# Patient Record
Sex: Female | Born: 1995 | Race: Black or African American | Hispanic: No | Marital: Single | Smoking: Current every day smoker
Health system: Southern US, Community
[De-identification: ages and names within clinical notes are randomized; demographics above are authoritative.]

## PROBLEM LIST (undated history)

## (undated) ENCOUNTER — Inpatient Hospital Stay (HOSPITAL_COMMUNITY): Payer: Self-pay

## (undated) ENCOUNTER — Inpatient Hospital Stay: Payer: Self-pay

## (undated) DIAGNOSIS — D649 Anemia, unspecified: Secondary | ICD-10-CM

## (undated) DIAGNOSIS — Z862 Personal history of diseases of the blood and blood-forming organs and certain disorders involving the immune mechanism: Secondary | ICD-10-CM

## (undated) DIAGNOSIS — A64 Unspecified sexually transmitted disease: Secondary | ICD-10-CM

## (undated) DIAGNOSIS — F209 Schizophrenia, unspecified: Secondary | ICD-10-CM

## (undated) HISTORY — PX: NO PAST SURGERIES: SHX2092

---
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
## 2017-03-14 ENCOUNTER — Encounter (HOSPITAL_COMMUNITY): Payer: Self-pay

## 2017-06-02 ENCOUNTER — Emergency Department
Admission: EM | Admit: 2017-06-02 | Discharge: 2017-06-02 | Disposition: A | Discharge status: Home / Self Care | Payer: Medicaid Other | Attending: Emergency Medicine | Admitting: Emergency Medicine

## 2017-06-02 ENCOUNTER — Encounter: Payer: Self-pay | Admitting: *Deleted

## 2017-06-02 ENCOUNTER — Emergency Department: Payer: Medicaid Other

## 2017-06-02 DIAGNOSIS — K920 Hematemesis: Secondary | ICD-10-CM

## 2017-06-02 DIAGNOSIS — R1030 Lower abdominal pain, unspecified: Secondary | ICD-10-CM | POA: Insufficient documentation

## 2017-06-02 DIAGNOSIS — Z3A17 17 weeks gestation of pregnancy: Secondary | ICD-10-CM | POA: Diagnosis not present

## 2017-06-02 DIAGNOSIS — O26892 Other specified pregnancy related conditions, second trimester: Secondary | ICD-10-CM | POA: Diagnosis present

## 2017-06-02 DIAGNOSIS — F17210 Nicotine dependence, cigarettes, uncomplicated: Secondary | ICD-10-CM | POA: Diagnosis not present

## 2017-06-02 DIAGNOSIS — R109 Unspecified abdominal pain: Secondary | ICD-10-CM

## 2017-06-02 LAB — URINALYSIS, COMPLETE (UACMP) WITH MICROSCOPIC
BACTERIA UA: NONE SEEN
Bilirubin Urine: NEGATIVE
Glucose, UA: NEGATIVE mg/dL
HGB URINE DIPSTICK: NEGATIVE
Ketones, ur: NEGATIVE mg/dL
Leukocytes, UA: NEGATIVE
Nitrite: NEGATIVE
PROTEIN: NEGATIVE mg/dL
RBC / HPF: NONE SEEN RBC/hpf (ref 0–5)
Specific Gravity, Urine: 1.017 (ref 1.005–1.030)
pH: 7 (ref 5.0–8.0)

## 2017-06-02 LAB — CBC WITH DIFFERENTIAL/PLATELET
Basophils Absolute: 0.1 10*3/uL (ref 0–0.1)
Basophils Relative: 0 %
EOS ABS: 0.2 10*3/uL (ref 0–0.7)
Eosinophils Relative: 2 %
HEMATOCRIT: 35.5 % (ref 35.0–47.0)
HEMOGLOBIN: 12.3 g/dL (ref 12.0–16.0)
Lymphocytes Relative: 14 %
Lymphs Abs: 1.9 10*3/uL (ref 1.0–3.6)
MCH: 30.9 pg (ref 26.0–34.0)
MCHC: 34.6 g/dL (ref 32.0–36.0)
MCV: 89.4 fL (ref 80.0–100.0)
MONOS PCT: 6 %
Monocytes Absolute: 0.9 10*3/uL (ref 0.2–0.9)
NEUTROS ABS: 10.8 10*3/uL — AB (ref 1.4–6.5)
NEUTROS PCT: 78 %
Platelets: 246 10*3/uL (ref 150–440)
RBC: 3.97 MIL/uL (ref 3.80–5.20)
RDW: 13.5 % (ref 11.5–14.5)
WBC: 13.8 10*3/uL — AB (ref 3.6–11.0)

## 2017-06-02 LAB — COMPREHENSIVE METABOLIC PANEL
ALK PHOS: 56 U/L (ref 38–126)
ALT: 20 U/L (ref 14–54)
ANION GAP: 8 (ref 5–15)
AST: 30 U/L (ref 15–41)
Albumin: 3 g/dL — ABNORMAL LOW (ref 3.5–5.0)
BILIRUBIN TOTAL: 0.4 mg/dL (ref 0.3–1.2)
BUN: 7 mg/dL (ref 6–20)
CALCIUM: 8.7 mg/dL — AB (ref 8.9–10.3)
CO2: 22 mmol/L (ref 22–32)
CREATININE: 0.79 mg/dL (ref 0.44–1.00)
Chloride: 106 mmol/L (ref 101–111)
GFR calc Af Amer: >60 mL/min (ref >60)
GFR calc non Af Amer: >60 mL/min (ref >60)
GLUCOSE: 90 mg/dL (ref 65–99)
Potassium: 3.4 mmol/L — ABNORMAL LOW (ref 3.5–5.1)
SODIUM: 136 mmol/L (ref 135–145)
TOTAL PROTEIN: 7.2 g/dL (ref 6.5–8.1)

## 2017-06-02 LAB — POCT PREGNANCY, URINE: PREG TEST UR: POSITIVE — AB

## 2017-06-02 LAB — TROPONIN I

## 2017-06-02 LAB — LIPASE, BLOOD: Lipase: 18 U/L (ref 11–51)

## 2017-06-02 MED ORDER — RANITIDINE HCL 75 MG PO TABS: 75.0000 mg | ORAL_TABLET | Freq: Two times a day (BID) | ORAL | 0 refills | Status: DC

## 2017-06-02 MED ORDER — ACETAMINOPHEN 325 MG PO TABS
650.0000 mg | ORAL_TABLET | Freq: Once | ORAL | Status: AC
  Administered 2017-06-02: 650 mg via ORAL
  Filled 2017-06-02: qty 2

## 2017-06-02 NOTE — ED Provider Notes (Signed)
[facility] Emergency Department Provider Note  ____________________________________________   First MD Initiated Contact with Patient 06/02/17 [id number]     (approximate)  I have reviewed the triage vital signs and the nursing notes.   HISTORY  Chief Complaint Emesis   HPI Eileen Moody is a 21 y.o. female who is presenting at 1617 weeks gestation with nausea and vomiting as well as lower abdominal pain. She says she was at work this morning when she started to vomit. She said that she had blood streaks mixed with vomit and then eventually progressed to "chunks of blood." She says that the "chunks of blood" were about quarter sized and occurred only at the last episode of vomiting. She said she had a total of 4 episodes this morning. Denies any diarrhea. Said that she had chest burning after vomiting and abdominal pain which also started after vomiting. She describes abdominal pain in the lower abdomen at this time and a 6 out of 10. She says it is constant and crampy and radiates through to her low back. She denies any vaginal bleeding or discharge. Says that she has been receiving prenatal care for this pregnancy and is taking prenatal vitamins. She says that [place] prenatal care [place] in [place]. She denies any burning with urination. York SpanielSaid that she has had vomiting one other time in this pregnancy but otherwise has not had any issue. Has been able to drink since vomiting this morning and denies any nausea at this time.   History reviewed. No pertinent past medical history.  There are no active problems to display for this patient.   History reviewed. No pertinent surgical history.  Prior to Admission medications   Not on File    Allergies Patient has no known allergies.  No family history on file.  Social History Social History  Substance Use Topics  . Smoking status: Current Every Day Smoker    Packs/day: 1.00    Types: Cigarettes  . Smokeless tobacco:  Not on file  . Alcohol use No    Review of Systems  Constitutional: No fever/chills Eyes: No visual changes. ENT: No sore throat. Cardiovascular: as above Respiratory: Denies shortness of breath. Gastrointestinal:  No diarrhea.  No constipation. Genitourinary: Negative for dysuria. Musculoskeletal: Negative for back pain. Skin: Negative for rash. Neurological: Negative for headaches, focal weakness or numbness.   ____________________________________________   PHYSICAL EXAM:  VITAL SIGNS: ED Triage Vitals [06/02/17 0814]  Enc Vitals Group     BP      Pulse      Resp 20     Temp 98.3 F (36.8 C)     Temp Source Oral     SpO2      Weight 164 lb (74.4 kg)     Height 5\' 5"  (1.651 m)     Head Circumference      Peak Flow      Pain Score 4     Pain Loc      Pain Edu?      Excl. in GC?     Constitutional: Alert and oriented. Well appearing and in no acute distress. Eyes: Conjunctivae are normal.  Head: Atraumatic. Nose: No congestion/rhinnorhea. Mouth/Throat: Mucous membranes are moist.  Neck: No stridor.   Cardiovascular: Normal rate, regular rhythm. Grossly normal heart sounds.   Respiratory: Normal respiratory effort.  No retractions. Lungs CTAB. Gastrointestinal: Soft and nontender. No distention.  Musculoskeletal: No lower extremity tenderness nor edema.  No joint effusions. Neurologic:  Normal  speech and language. No gross focal neurologic deficits are appreciated. Skin:  Skin is warm, dry and intact. No rash noted. Psychiatric: Mood and affect are normal. Speech and behavior are normal.  ____________________________________________   LABS (all labs ordered are listed, but only abnormal results are displayed)  Labs Reviewed  CBC WITH DIFFERENTIAL/PLATELET - Abnormal; Notable for the following:       Result Value   WBC 13.8 (*)    Neutro Abs 10.8 (*)    All other components within normal limits  COMPREHENSIVE METABOLIC PANEL - Abnormal; Notable for the  following:    Potassium 3.4 (*)    Calcium 8.7 (*)    Albumin 3.0 (*)    All other components within normal limits  URINALYSIS, COMPLETE (UACMP) WITH MICROSCOPIC - Abnormal; Notable for the following:    Color, Urine YELLOW (*)    APPearance HAZY (*)    Squamous Epithelial / LPF 0-5 (*)    All other components within normal limits  POCT PREGNANCY, URINE - Abnormal; Notable for the following:    Preg Test, Ur POSITIVE (*)    All other components within normal limits  LIPASE, BLOOD  TROPONIN I  POC URINE PREG, ED   ____________________________________________  EKG  ED ECG REPORT I, Eileen Moody, the attending physician, personally viewed and interpreted this ECG.   Date: 06/02/2017  EKG Time: 0820  Rate: 82  Rhythm: normal sinus rhythm  Axis: Normal  Intervals:none  ST&T Change: No ST segment elevation or depression. No abnormal T-wave inversion.  ____________________________________________  RADIOLOGY  Unremarkable single IUP. ____________________________________________   PROCEDURES  Procedure(s) performed:   Procedures  Critical Care performed:   ____________________________________________   INITIAL IMPRESSION / ASSESSMENT AND PLAN / ED COURSE  Pertinent labs & imaging results that were available during my care of the patient were reviewed by me and considered in my medical decision making (see chart for details).  ----------------------------------------- 11:02 AM on 06/02/2017 ----------------------------------------- Patient without any complaints at this time. Actually caught the patient trying to elope but asked the patient if she would return to the room. No further episodes of vomiting. We'll discharge with Zantac. She'll be following up the [facility]. Possible gastritis causing hematemesis versus very mild Mallory-Weiss. However, the patient is chest pain-free at this time. No indication of serious pathology. Also  drinking a soft drink throughout her visit.       ____________________________________________   FINAL CLINICAL IMPRESSION(S) / ED DIAGNOSES  Final diagnoses:  Lower abdominal pain   Hematemesis.   NEW MEDICATIONS STARTED DURING THIS VISIT:  New Prescriptions   No medications on file     Note:  This document was prepared using Dragon voice recognition software and may include unintentional dictation errors.     Myrna Blazer, MD 06/02/17 [phone]

## 2017-06-02 NOTE — ED Triage Notes (Signed)
Pt is [redacted] weeks pregnant, pt reports getting to work today vomited once , pt reports vomiting blood today, pt complains of abdominal and chest pain

## 2017-06-11 ENCOUNTER — Ambulatory Visit (HOSPITAL_COMMUNITY)
Admission: EM | Admit: 2017-06-11 | Discharge: 2017-06-11 | Disposition: A | Discharge status: Home / Self Care | Payer: Medicaid Other | Attending: Family Medicine | Admitting: Family Medicine

## 2017-06-11 ENCOUNTER — Encounter (HOSPITAL_COMMUNITY): Payer: Self-pay | Admitting: Emergency Medicine

## 2017-06-11 DIAGNOSIS — Z113 Encounter for screening for infections with a predominantly sexual mode of transmission: Secondary | ICD-10-CM

## 2017-06-11 DIAGNOSIS — F17210 Nicotine dependence, cigarettes, uncomplicated: Secondary | ICD-10-CM | POA: Insufficient documentation

## 2017-06-11 DIAGNOSIS — Z202 Contact with and (suspected) exposure to infections with a predominantly sexual mode of transmission: Secondary | ICD-10-CM | POA: Diagnosis present

## 2017-06-11 DIAGNOSIS — Z331 Pregnant state, incidental: Secondary | ICD-10-CM

## 2017-06-11 DIAGNOSIS — N898 Other specified noninflammatory disorders of vagina: Secondary | ICD-10-CM

## 2017-06-11 DIAGNOSIS — N760 Acute vaginitis: Secondary | ICD-10-CM

## 2017-06-11 LAB — POCT URINALYSIS DIP (DEVICE)
BILIRUBIN URINE: NEGATIVE
GLUCOSE, UA: NEGATIVE mg/dL
Hgb urine dipstick: NEGATIVE
LEUKOCYTES UA: NEGATIVE
Nitrite: NEGATIVE
Protein, ur: 30 mg/dL — AB
Specific Gravity, Urine: >=1.03 (ref 1.005–1.030)
Urobilinogen, UA: 1 mg/dL (ref 0.0–1.0)
pH: 6 (ref 5.0–8.0)

## 2017-06-11 MED ORDER — FLUCONAZOLE 150 MG PO TABS: 150.0000 mg | ORAL_TABLET | Freq: Once | ORAL | 0 refills | Status: AC

## 2017-06-11 NOTE — ED Notes (Signed)
Sent to bathroom to obtain a clean and dirty urine

## 2017-06-11 NOTE — ED Triage Notes (Signed)
Vaginal itching and irritation

## 2017-06-11 NOTE — Discharge Instructions (Signed)
The preliminary urine results are normal. Therefore proceeding with treatment of a yeast infection.  If your symptoms do not improve in 48 hours, or if you experience worsening symptoms, please go to [facility]

## 2017-06-11 NOTE — ED Provider Notes (Signed)
[facility]    CSN: [id number] Arrival date & time: 06/11/17  1827     History   Chief Complaint Chief Complaint  Patient presents with  . Exposure to STD    HPI Eileen Moody is a 21 y.o. female.   Is a 21 year old gravida 3 para 2 woman who is 20+ weeks pregnant and has postcoital soreness for the last couple days. She saw her regular doctor in May and was tested for all STDs. She's been monogamous with her fianc but is not sure his pain monogamous with her. She wants complete STD testing.  Patient has no discharge or bleeding. She denies fever.      History reviewed. No pertinent past medical history.  There are no active problems to display for this patient.   History reviewed. No pertinent surgical history.  OB History    Gravida Para Term Preterm AB Living   2             SAB TAB Ectopic Multiple Live Births                   Home Medications    Prior to Admission medications   Medication Sig Start Date End Date Taking? Authorizing Provider  Prenatal Multivit-Min-Fe-FA (PRENATAL VITAMINS PO) Take by mouth.   Yes [provider]  fluconazole (DIFLUCAN) 150 MG tablet Take 1 tablet (150 mg total) by mouth once. Repeat if needed 06/11/17 06/11/17  Elvina Sidle, MD    Family History No family history on file.  Social History Social History  Substance Use Topics  . Smoking status: Current Every Day Smoker    Packs/day: 1.00    Types: Cigarettes  . Smokeless tobacco: Not on file  . Alcohol use No     Allergies   Patient has no known allergies.   Review of Systems Review of Systems  Genitourinary: Positive for vaginal pain.  All other systems reviewed and are negative.    Physical Exam Triage Vital Signs ED Triage Vitals  Enc Vitals Group     BP 06/11/17 1904 (!) 99/53     Pulse Rate 06/11/17 1904 79     Resp 06/11/17 1904 18     Temp 06/11/17 1904 98.4 F (36.9 C)     Temp Source 06/11/17 1904 Oral     SpO2  06/11/17 1904 99 %     Weight --      Height --      Head Circumference --      Peak Flow --      Pain Score 06/11/17 1901 7     Pain Loc --      Pain Edu? --      Excl. in GC? --    No data found.   Updated Vital Signs BP (!) 99/53 (BP Location: Right Arm)   Pulse 79   Temp 98.4 F (36.9 C) (Oral)   Resp 18   SpO2 99%    Physical Exam  Constitutional: She is oriented to person, place, and time. She appears well-developed and well-nourished.  HENT:  Right Ear: External ear normal.  Left Ear: External ear normal.  Mouth/Throat: Oropharynx is clear and moist.  Eyes: Conjunctivae are normal. Pupils are equal, round, and reactive to light.  Neck: Normal range of motion. Neck supple.  Pulmonary/Chest: Effort normal.  Musculoskeletal: Normal range of motion.  Neurological: She is alert and oriented to person, place, and time.  Skin: Skin is warm and dry.  Nursing note and vitals reviewed.    UC Treatments / Results  Labs (all labs ordered are listed, but only abnormal results are displayed) Labs Reviewed  POCT URINALYSIS DIP (DEVICE) - Abnormal; Notable for the following:       Result Value   Ketones, ur TRACE (*)    Protein, ur 30 (*)    All other components within normal limits  URINE CYTOLOGY ANCILLARY ONLY    EKG  EKG Interpretation None       Radiology No results found.  Procedures Procedures (including critical care time)  Medications Ordered in UC Medications - No data to display   Initial Impression / Assessment and Plan / UC Course  I have reviewed the triage vital signs and the nursing notes.  Pertinent labs & imaging results that were available during my care of the patient were reviewed by me and considered in my medical decision making (see chart for details).     Final Clinical Impressions(s) / UC Diagnoses   Final diagnoses:  Vaginitis and vulvovaginitis    New Prescriptions New Prescriptions   FLUCONAZOLE (DIFLUCAN) 150 MG  TABLET    Take 1 tablet (150 mg total) by mouth once. Repeat if needed     Elvina SidleLauenstein, Sarra Rachels, MD 06/11/17 1950

## 2017-06-12 LAB — URINE CYTOLOGY ANCILLARY ONLY
Chlamydia: NEGATIVE
Neisseria Gonorrhea: NEGATIVE
Trichomonas: NEGATIVE

## 2017-06-14 LAB — URINE CYTOLOGY ANCILLARY ONLY
Bacterial vaginitis: NEGATIVE
Candida vaginitis: NEGATIVE

## 2017-07-12 LAB — OB RESULTS CONSOLE RPR: RPR: NONREACTIVE

## 2017-07-12 LAB — OB RESULTS CONSOLE RUBELLA ANTIBODY, IGM: RUBELLA: NON-IMMUNE/NOT IMMUNE

## 2017-07-12 LAB — OB RESULTS CONSOLE HIV ANTIBODY (ROUTINE TESTING): HIV: NONREACTIVE

## 2017-07-12 LAB — OB RESULTS CONSOLE HEPATITIS B SURFACE ANTIGEN: HEP B S AG: NEGATIVE

## 2017-07-12 LAB — OB RESULTS CONSOLE VARICELLA ZOSTER ANTIBODY, IGG: VARICELLA IGG: IMMUNE

## 2017-08-22 ENCOUNTER — Encounter (HOSPITAL_COMMUNITY): Payer: Self-pay | Admitting: *Deleted

## 2017-08-22 ENCOUNTER — Inpatient Hospital Stay (HOSPITAL_COMMUNITY): Payer: Medicaid Other

## 2017-08-22 ENCOUNTER — Inpatient Hospital Stay (HOSPITAL_COMMUNITY)
Admission: AD | Admit: 2017-08-22 | Discharge: 2017-08-22 | Disposition: A | Discharge status: Home / Self Care | Payer: Medicaid Other | Source: Ambulatory Visit | Attending: Obstetrics and Gynecology | Admitting: Obstetrics and Gynecology

## 2017-08-22 DIAGNOSIS — O368390 Maternal care for abnormalities of the fetal heart rate or rhythm, unspecified trimester, not applicable or unspecified: Secondary | ICD-10-CM | POA: Diagnosis not present

## 2017-08-22 DIAGNOSIS — Z3A30 30 weeks gestation of pregnancy: Secondary | ICD-10-CM | POA: Insufficient documentation

## 2017-08-22 DIAGNOSIS — O368930 Maternal care for other specified fetal problems, third trimester, not applicable or unspecified: Secondary | ICD-10-CM | POA: Insufficient documentation

## 2017-08-22 DIAGNOSIS — R609 Edema, unspecified: Secondary | ICD-10-CM

## 2017-08-22 LAB — URINALYSIS, ROUTINE W REFLEX MICROSCOPIC
BILIRUBIN URINE: NEGATIVE
GLUCOSE, UA: NEGATIVE mg/dL
HGB URINE DIPSTICK: NEGATIVE
Ketones, ur: NEGATIVE mg/dL
NITRITE: NEGATIVE
Protein, ur: NEGATIVE mg/dL
SPECIFIC GRAVITY, URINE: 1.015 (ref 1.005–1.030)
pH: 6 (ref 5.0–8.0)

## 2017-08-22 MED ORDER — ACETAMINOPHEN 325 MG PO TABS
650.0000 mg | ORAL_TABLET | Freq: Four times a day (QID) | ORAL | Status: DC | PRN
  Administered 2017-08-22: 650 mg via ORAL
  Filled 2017-08-22: qty 2

## 2017-08-22 NOTE — MAU Provider Note (Signed)
Chief Complaint:  Headache   First Provider Initiated Contact with Patient 08/22/17 2010     HPI: Eileen Moody is a 21 y.o. G3P2002 at 6w4dwho presents to maternity admissions reporting swelling in feet for the past 2 weeks.  Also has intermittent headaches with visual changes  Does admit that her migraines in past have presented like that.  Mild headache now. Has not tried any Tylenol.  Foot swelling goes down after elevation.  Thinks she has preeclampsia.. She reports good fetal movement, denies LOF, vaginal bleeding, vaginal itching/burning, urinary symptoms,  dizziness, n/v, diarrhea, constipation or fever/chills.  She denies visual changes or RUQ abdominal pain.  Headache   This is a recurrent problem. The current episode started today. The problem occurs intermittently. The problem has been waxing and waning. The quality of the pain is described as aching. The pain is mild. Pertinent negatives include no abdominal pain, blurred vision, fever, photophobia, seizures, sinus pressure, sore throat or visual change. Nothing aggravates the symptoms. She has tried nothing for the symptoms.    RN Note: Pt started having some swelling in her hands and feet about two weeks ago as well as headaches and seeing stars.  She elevates her feet occasionally to help.  Has taken tylenol in the past for the headaches and it helped.   Past Medical History: History reviewed. No pertinent past medical history.  Past obstetric history: OB History  Gravida Para Term Preterm AB Living  3 2 2     2   SAB TAB Ectopic Multiple Live Births          2    # Outcome Date GA Lbr Len/2nd Weight Sex Delivery Anes PTL Lv  3 Current           2 Term 2017     Vag-Spont   LIV  1 Term 2014     Vag-Spont   LIV      Past Surgical History: History reviewed. No pertinent surgical history.  Family History: History reviewed. No pertinent family history.  Social History: Social History  Substance Use Topics  .  Smoking status: Current Every Day Smoker    Packs/day: 1.00    Types: Cigarettes  . Smokeless tobacco: Never Used  . Alcohol use No    Allergies: No Known Allergies  Meds:  Prescriptions Prior to Admission  Medication Sig Dispense Refill Last Dose  . Prenatal Multivit-Min-Fe-FA (PRENATAL VITAMINS PO) Take by mouth.       I have reviewed patient's Past Medical Hx, Surgical Hx, Family Hx, Social Hx, medications and allergies.   ROS:  Review of Systems  Constitutional: Negative for fever.  HENT: Negative for sinus pressure and sore throat.   Eyes: Negative for blurred vision and photophobia.  Gastrointestinal: Negative for abdominal pain.  Neurological: Positive for headaches. Negative for seizures.   Other systems negative  Physical Exam  Patient Vitals for the past 24 hrs:  BP Temp Temp src Pulse Resp  08/22/17 1954 122/76 97.8 F (36.6 C) Oral (!) 102 16   Vitals:   08/22/17 2041 08/22/17 2051 08/22/17 2100 08/22/17 2244  BP: (!) 112/53 (!) 108/58 107/61 (!) 110/52  Pulse: 92 93 92 89  Resp:    18  Temp:    98.1 F (36.7 C)  TempSrc:    Oral    Constitutional: Well-developed, well-nourished female in no acute distress.  Cardiovascular: normal rate and rhythm Respiratory: normal effort, clear to auscultation bilaterally GI: Abd soft, non-tender, gravid appropriate  for gestational age.   No rebound or guarding. MS: Extremities nontender, maybe slight trace pedal edema, normal ROM Neurologic: Alert and oriented x 4.  GU: Neg CVAT.  PELVIC EXAM:  deferred  FHT:  Baseline 125 , moderate variability, accelerations present, no decelerations, but there was one area where FHR went down tow 115-120,?decel Contractions:  Rare   Labs: Results for orders placed or performed during the hospital encounter of 08/22/17 (from the past 24 hour(s))  Urinalysis, Routine w reflex microscopic     Status: Abnormal   Collection Time: 08/22/17  7:43 PM  Result Value Ref Range    Color, Urine YELLOW YELLOW   APPearance CLOUDY (A) CLEAR   Specific Gravity, Urine 1.015 1.005 - 1.030   pH 6.0 5.0 - 8.0   Glucose, UA NEGATIVE NEGATIVE mg/dL   Hgb urine dipstick NEGATIVE NEGATIVE   Bilirubin Urine NEGATIVE NEGATIVE   Ketones, ur NEGATIVE NEGATIVE mg/dL   Protein, ur NEGATIVE NEGATIVE mg/dL   Nitrite NEGATIVE NEGATIVE   Leukocytes, UA LARGE (A) NEGATIVE   RBC / HPF 6-30 0 - 5 RBC/hpf   WBC, UA TOO NUMEROUS TO COUNT 0 - 5 WBC/hpf   Bacteria, UA MANY (A) NONE SEEN   Squamous Epithelial / LPF TOO NUMEROUS TO COUNT (A) NONE SEEN   Mucus PRESENT       Imaging:  US ordered BPP 8/8 Amniotic fluid normal  MAU Course/MDM: I have ordered labs and reviewed results.  UA with leukocytes but it is not a good clean catch. NST reviewed BPP ordered due to questionable small decel lasting 20 seconds.  BPP 8/8 with normal fluid BPs are totally normal, even on the low side Discussed hypertension is essential in diagnosis of preeclampsia Discussed dependent edema Push fluids and elevate feet  Assessment: Single IUP at 5210w5d Dependent edema Questionable FHR decel with normal BPP  Plan: Discharge home Watch swelling, get BP checked if it increases. Preterm Labor precautions and fetal kick counts Follow up in Office for prenatal visits and recheck  Encouraged to return here or to other Urgent Care/ED if she develops worsening of symptoms, increase in pain, fever, or other concerning symptoms.   Pt stable at time of discharge.  Wynelle BourgeoisMarie Korion Cuevas CNM, MSN Certified Nurse-Midwife 08/22/2017 8:34 PM

## 2017-08-22 NOTE — Discharge Instructions (Signed)
Peripheral Edema Peripheral edema is swelling that is caused by a buildup of fluid. Peripheral edema most often affects the lower legs, ankles, and feet. It can also develop in the arms, hands, and face. The area of the body that has peripheral edema will look swollen. It may also feel heavy or warm. Your clothes may start to feel tight. Pressing on the area may make a temporary dent in your skin. You may not be able to move your arm or leg as much as usual. There are many causes of peripheral edema. It can be a complication of other diseases, such as congestive heart failure, kidney disease, or a problem with your blood circulation. It also can be a side effect of certain medicines. It often happens to women during pregnancy. Sometimes, the cause is not known. Treating the underlying condition is often the only treatment for peripheral edema. Follow these instructions at home: Pay attention to any changes in your symptoms. Take these actions to help with your discomfort:  Raise (elevate) your legs while you are sitting or lying down.  Move around often to prevent stiffness and to lessen swelling. Do not sit or stand for long periods of time.  Wear support stockings as told by your health care provider.  Follow instructions from your health care provider about limiting salt (sodium) in your diet. Sometimes eating less salt can reduce swelling.  Take over-the-counter and prescription medicines only as told by your health care provider. Your health care provider may prescribe medicine to help your body get rid of excess water (diuretic).  Keep all follow-up visits as told by your health care provider. This is important.  Contact a health care provider if:  You have a fever.  Your edema starts suddenly or is getting worse, especially if you are pregnant or have a medical condition.  You have swelling in only one leg.  You have increased swelling and pain in your legs. Get help right away  if:  You develop shortness of breath, especially when you are lying down.  You have pain in your chest or abdomen.  You feel weak.  You faint. This information is not intended to replace advice given to you by your health care provider. Make sure you discuss any questions you have with your health care provider. Document Released: 01/11/2005 Document Revised: 05/08/2016 Document Reviewed: 06/16/2015 Elsevier Interactive Patient Education  2018 [employer].  Third Trimester of Pregnancy The third trimester is from week 28 through week 40 (months 7 through 9). The third trimester is a time when the unborn baby (fetus) is growing rapidly. At the end of the ninth month, the fetus is about 20 inches in length and weighs 6-10 pounds. Body changes during your third trimester Your body will continue to go through many changes during pregnancy. The changes vary from woman to woman. During the third trimester:  Your weight will continue to increase. You can expect to gain 25-35 pounds (11-16 kg) by the end of the pregnancy.  You may begin to get stretch marks on your hips, abdomen, and breasts.  You may urinate more often because the fetus is moving lower into your pelvis and pressing on your bladder.  You may develop or continue to have heartburn. This is caused by increased hormones that slow down muscles in the digestive tract.  You may develop or continue to have constipation because increased hormones slow digestion and cause the muscles that push waste through your intestines to relax.  You may develop hemorrhoids. These are swollen veins (varicose veins) in the rectum that can itch or be painful.  You may develop swollen, bulging veins (varicose veins) in your legs.  You may have increased body aches in the pelvis, back, or thighs. This is due to weight gain and increased hormones that are relaxing your joints.  You may have changes in your hair. These can include thickening of your  hair, rapid growth, and changes in texture. Some women also have hair loss during or after pregnancy, or hair that feels dry or thin. Your hair will most likely return to normal after your baby is born.  Your breasts will continue to grow and they will continue to become tender. A yellow fluid (colostrum) may leak from your breasts. This is the first milk you are producing for your baby.  Your belly button may stick out.  You may notice more swelling in your hands, face, or ankles.  You may have increased tingling or numbness in your hands, arms, and legs. The skin on your belly may also feel numb.  You may feel short of breath because of your expanding uterus.  You may have more problems sleeping. This can be caused by the size of your belly, increased need to urinate, and an increase in your body's metabolism.  You may notice the fetus "dropping," or moving lower in your abdomen (lightening).  You may have increased vaginal discharge.  You may notice your joints feel loose and you may have pain around your pelvic bone.  What to expect at prenatal visits You will have prenatal exams every 2 weeks until week 36. Then you will have weekly prenatal exams. During a routine prenatal visit:  You will be weighed to make sure you and the baby are growing normally.  Your blood pressure will be taken.  Your abdomen will be measured to track your baby's growth.  The fetal heartbeat will be listened to.  Any test results from the previous visit will be discussed.  You may have a cervical check near your due date to see if your cervix has softened or thinned (effaced).  You will be tested for Group B streptococcus. This happens between 35 and 37 weeks.  Your health care provider may ask you:  What your birth plan is.  How you are feeling.  If you are feeling the baby move.  If you have had any abnormal symptoms, such as leaking fluid, bleeding, severe headaches, or abdominal  cramping.  If you are using any tobacco products, including cigarettes, chewing tobacco, and electronic cigarettes.  If you have any questions.  Other tests or screenings that may be performed during your third trimester include:  Blood tests that check for low iron levels (anemia).  Fetal testing to check the health, activity level, and growth of the fetus. Testing is done if you have certain medical conditions or if there are problems during the pregnancy.  Nonstress test (NST). This test checks the health of your baby to make sure there are no signs of problems, such as the baby not getting enough oxygen. During this test, a belt is placed around your belly. The baby is made to move, and its heart rate is monitored during movement.  What is false labor? False labor is a condition in which you feel small, irregular tightenings of the muscles in the womb (contractions) that usually go away with rest, changing position, or drinking water. These are called Braxton Hicks contractions. Contractions  may last for hours, days, or even weeks before true labor sets in. If contractions come at regular intervals, become more frequent, increase in intensity, or become painful, you should see your health care provider. What are the signs of labor?  Abdominal cramps.  Regular contractions that start at 10 minutes apart and become stronger and more frequent with time.  Contractions that start on the top of the uterus and spread down to the lower abdomen and back.  Increased pelvic pressure and dull back pain.  A watery or bloody mucus discharge that comes from the vagina.  Leaking of amniotic fluid. This is also known as your "water breaking." It could be a slow trickle or a gush. Let your health care provider know if it has a color or strange odor. If you have any of these signs, call your health care provider right away, even if it is before your due date. Follow these instructions at  home: Medicines  Follow your health care provider's instructions regarding medicine use. Specific medicines may be either safe or unsafe to take during pregnancy.  Take a prenatal vitamin that contains at least 600 micrograms (mcg) of folic acid.  If you develop constipation, try taking a stool softener if your health care provider approves. Eating and drinking  Eat a balanced diet that includes fresh fruits and vegetables, whole grains, good sources of protein such as meat, eggs, or tofu, and low-fat dairy. Your health care provider will help you determine the amount of weight gain that is right for you.  Avoid raw meat and uncooked cheese. These carry germs that can cause birth defects in the baby.  If you have low calcium intake from food, talk to your health care provider about whether you should take a daily calcium supplement.  Eat four or five small meals rather than three large meals a day.  Limit foods that are high in fat and processed sugars, such as fried and sweet foods.  To prevent constipation: ? Drink enough fluid to keep your urine clear or pale yellow. ? Eat foods that are high in fiber, such as fresh fruits and vegetables, whole grains, and beans. Activity  Exercise only as directed by your health care provider. Most women can continue their usual exercise routine during pregnancy. Try to exercise for 30 minutes at least 5 days a week. Stop exercising if you experience uterine contractions.  Avoid heavy lifting.  Do not exercise in extreme heat or humidity, or at high altitudes.  Wear low-heel, comfortable shoes.  Practice good posture.  You may continue to have sex unless your health care provider tells you otherwise. Relieving pain and discomfort  Take frequent breaks and rest with your legs elevated if you have leg cramps or low back pain.  Take warm sitz baths to soothe any pain or discomfort caused by hemorrhoids. Use hemorrhoid cream if your health  care provider approves.  Wear a good support bra to prevent discomfort from breast tenderness.  If you develop varicose veins: ? Wear support pantyhose or compression stockings as told by your healthcare provider. ? Elevate your feet for 15 minutes, 3-4 times a day. Prenatal care  Write down your questions. Take them to your prenatal visits.  Keep all your prenatal visits as told by your health care provider. This is important. Safety  Wear your seat belt at all times when driving.  Make a list of emergency phone numbers, including numbers for family, friends, the hospital, and police and  Garment/textile technologist. General instructions  Avoid cat litter boxes and soil used by cats. These carry germs that can cause birth defects in the baby. If you have a cat, ask someone to clean the litter box for you.  Do not travel far distances unless it is absolutely necessary and only with the approval of your health care provider.  Do not use hot tubs, steam rooms, or saunas.  Do not drink alcohol.  Do not use any products that contain nicotine or tobacco, such as cigarettes and e-cigarettes. If you need help quitting, ask your health care provider.  Do not use any medicinal herbs or unprescribed drugs. These chemicals affect the formation and growth of the baby.  Do not douche or use tampons or scented sanitary pads.  Do not cross your legs for long periods of time.  To prepare for the arrival of your baby: ? Take prenatal classes to understand, practice, and ask questions about labor and delivery. ? Make a trial run to the hospital. ? Visit the hospital and tour the maternity area. ? Arrange for maternity or paternity leave through employers. ? Arrange for family and friends to take care of pets while you are in the hospital. ? Purchase a rear-facing car seat and make sure you know how to install it in your car. ? Pack your hospital bag. ? Prepare the babys nursery. Make sure to remove all  pillows and stuffed animals from the baby's crib to prevent suffocation.  Visit your dentist if you have not gone during your pregnancy. Use a soft toothbrush to brush your teeth and be gentle when you floss. Contact a health care provider if:  You are unsure if you are in labor or if your water has broken.  You become dizzy.  You have mild pelvic cramps, pelvic pressure, or nagging pain in your abdominal area.  You have lower back pain.  You have persistent nausea, vomiting, or diarrhea.  You have an unusual or bad smelling vaginal discharge.  You have pain when you urinate. Get help right away if:  Your water breaks before 37 weeks.  You have regular contractions less than 5 minutes apart before 37 weeks.  You have a fever.  You are leaking fluid from your vagina.  You have spotting or bleeding from your vagina.  You have severe abdominal pain or cramping.  You have rapid weight loss or weight gain.  You have shortness of breath with chest pain.  You notice sudden or extreme swelling of your face, hands, ankles, feet, or legs.  Your baby makes fewer than 10 movements in 2 hours.  You have severe headaches that do not go away when you take medicine.  You have vision changes. Summary  The third trimester is from week 28 through week 40, months 7 through 9. The third trimester is a time when the unborn baby (fetus) is growing rapidly.  During the third trimester, your discomfort may increase as you and your baby continue to gain weight. You may have abdominal, leg, and back pain, sleeping problems, and an increased need to urinate.  During the third trimester your breasts will keep growing and they will continue to become tender. A yellow fluid (colostrum) may leak from your breasts. This is the first milk you are producing for your baby.  False labor is a condition in which you feel small, irregular tightenings of the muscles in the womb (contractions) that  eventually go away. These are called Deberah Pelton  contractions. Contractions may last for hours, days, or even weeks before true labor sets in.  Signs of labor can include: abdominal cramps; regular contractions that start at 10 minutes apart and become stronger and more frequent with time; watery or bloody mucus discharge that comes from the vagina; increased pelvic pressure and dull back pain; and leaking of amniotic fluid. This information is not intended to replace advice given to you by your health care provider. Make sure you discuss any questions you have with your health care provider. Document Released: 11/28/2001 Document Revised: 05/11/2016 Document Reviewed: 02/04/2013 Elsevier Interactive Patient Education  2017 [employer].

## 2017-08-22 NOTE — MAU Note (Signed)
Pt started having some swelling in her hands and feet about two weeks ago as well as headaches and seeing stars.  She elevates her feet occasionally to help.  Has taken tylenol in the past for the headaches and it helped.

## 2017-09-07 LAB — OB RESULTS CONSOLE HIV ANTIBODY (ROUTINE TESTING): HIV: NONREACTIVE

## 2017-09-07 LAB — OB RESULTS CONSOLE RPR: RPR: NONREACTIVE

## 2017-10-03 LAB — OB RESULTS CONSOLE GBS: GBS: POSITIVE

## 2017-10-07 LAB — OB RESULTS CONSOLE GC/CHLAMYDIA
Chlamydia: NEGATIVE
Gonorrhea: NEGATIVE

## 2017-10-21 ENCOUNTER — Inpatient Hospital Stay: Payer: Medicaid Other | Admitting: Anesthesiology

## 2017-10-21 ENCOUNTER — Inpatient Hospital Stay
Admission: EM | Admit: 2017-10-21 | Discharge: 2017-10-23 | DRG: 807 | Disposition: A | Discharge status: Home / Self Care | Payer: Medicaid Other | Attending: Obstetrics and Gynecology | Admitting: Obstetrics and Gynecology

## 2017-10-21 ENCOUNTER — Other Ambulatory Visit: Payer: Self-pay

## 2017-10-21 DIAGNOSIS — Z3A39 39 weeks gestation of pregnancy: Secondary | ICD-10-CM

## 2017-10-21 DIAGNOSIS — O09299 Supervision of pregnancy with other poor reproductive or obstetric history, unspecified trimester: Secondary | ICD-10-CM

## 2017-10-21 DIAGNOSIS — Z8759 Personal history of other complications of pregnancy, childbirth and the puerperium: Secondary | ICD-10-CM

## 2017-10-21 DIAGNOSIS — D649 Anemia, unspecified: Secondary | ICD-10-CM | POA: Diagnosis present

## 2017-10-21 DIAGNOSIS — O99334 Smoking (tobacco) complicating childbirth: Secondary | ICD-10-CM

## 2017-10-21 DIAGNOSIS — O9902 Anemia complicating childbirth: Secondary | ICD-10-CM

## 2017-10-21 DIAGNOSIS — O99824 Streptococcus B carrier state complicating childbirth: Secondary | ICD-10-CM | POA: Diagnosis present

## 2017-10-21 DIAGNOSIS — O09899 Supervision of other high risk pregnancies, unspecified trimester: Secondary | ICD-10-CM

## 2017-10-21 DIAGNOSIS — O99330 Smoking (tobacco) complicating pregnancy, unspecified trimester: Secondary | ICD-10-CM

## 2017-10-21 DIAGNOSIS — Z3483 Encounter for supervision of other normal pregnancy, third trimester: Secondary | ICD-10-CM | POA: Diagnosis present

## 2017-10-21 DIAGNOSIS — F17210 Nicotine dependence, cigarettes, uncomplicated: Secondary | ICD-10-CM | POA: Diagnosis present

## 2017-10-21 DIAGNOSIS — O0930 Supervision of pregnancy with insufficient antenatal care, unspecified trimester: Secondary | ICD-10-CM

## 2017-10-21 DIAGNOSIS — Z3400 Encounter for supervision of normal first pregnancy, unspecified trimester: Secondary | ICD-10-CM

## 2017-10-21 DIAGNOSIS — F17200 Nicotine dependence, unspecified, uncomplicated: Secondary | ICD-10-CM

## 2017-10-21 HISTORY — DX: Anemia, unspecified: D64.9

## 2017-10-21 HISTORY — DX: Personal history of diseases of the blood and blood-forming organs and certain disorders involving the immune mechanism: Z86.2

## 2017-10-21 HISTORY — DX: Unspecified sexually transmitted disease: A64

## 2017-10-21 LAB — URINE DRUG SCREEN, QUALITATIVE (ARMC ONLY)
Amphetamines, Ur Screen: NOT DETECTED
Barbiturates, Ur Screen: NOT DETECTED
Benzodiazepine, Ur Scrn: NOT DETECTED
Cannabinoid 50 Ng, Ur ~~LOC~~: NOT DETECTED
Cocaine Metabolite,Ur ~~LOC~~: NOT DETECTED
MDMA (Ecstasy)Ur Screen: NOT DETECTED
Methadone Scn, Ur: NOT DETECTED
Opiate, Ur Screen: NOT DETECTED
PHENCYCLIDINE (PCP) UR S: NOT DETECTED
TRICYCLIC, UR SCREEN: NOT DETECTED

## 2017-10-21 LAB — CBC WITH DIFFERENTIAL/PLATELET
BASOS ABS: 0.1 10*3/uL (ref 0–0.1)
BASOS PCT: 0 %
EOS PCT: 3 %
Eosinophils Absolute: 0.4 10*3/uL (ref 0–0.7)
HCT: 33.8 % — ABNORMAL LOW (ref 35.0–47.0)
Hemoglobin: 10.9 g/dL — ABNORMAL LOW (ref 12.0–16.0)
LYMPHS PCT: 17 %
Lymphs Abs: 2.3 10*3/uL (ref 1.0–3.6)
MCH: 28 pg (ref 26.0–34.0)
MCHC: 32.1 g/dL (ref 32.0–36.0)
MCV: 87.2 fL (ref 80.0–100.0)
MONO ABS: 1.3 10*3/uL — AB (ref 0.2–0.9)
Monocytes Relative: 10 %
Neutro Abs: 9.6 10*3/uL — ABNORMAL HIGH (ref 1.4–6.5)
Neutrophils Relative %: 70 %
PLATELETS: 224 10*3/uL (ref 150–440)
RBC: 3.87 MIL/uL (ref 3.80–5.20)
RDW: 14.2 % (ref 11.5–14.5)
WBC: 13.6 10*3/uL — AB (ref 3.6–11.0)

## 2017-10-21 LAB — TYPE AND SCREEN
ABO/RH(D): A POS
Antibody Screen: NEGATIVE

## 2017-10-21 LAB — RAPID HIV SCREEN (HIV 1/2 AB+AG)
HIV 1/2 ANTIBODIES: NONREACTIVE
HIV-1 P24 ANTIGEN - HIV24: NONREACTIVE

## 2017-10-21 LAB — CHLAMYDIA/NGC RT PCR (ARMC ONLY)
Chlamydia Tr: NOT DETECTED
N gonorrhoeae: NOT DETECTED

## 2017-10-21 MED ORDER — DIBUCAINE 1 % RE OINT: 1.0000 "application " | TOPICAL_OINTMENT | RECTAL | Status: DC | PRN

## 2017-10-21 MED ORDER — DIPHENHYDRAMINE HCL 50 MG/ML IJ SOLN: 12.5000 mg | INTRAMUSCULAR | Status: DC | PRN

## 2017-10-21 MED ORDER — COCONUT OIL OIL
1.0000 "application " | TOPICAL_OIL | Status: DC | PRN
  Filled 2017-10-21: qty 120

## 2017-10-21 MED ORDER — IBUPROFEN 800 MG PO TABS
800.0000 mg | ORAL_TABLET | Freq: Four times a day (QID) | ORAL | Status: DC
  Administered 2017-10-21 – 2017-10-23 (×9): 800 mg via ORAL
  Filled 2017-10-21 (×9): qty 1

## 2017-10-21 MED ORDER — BUPIVACAINE HCL (PF) 0.25 % IJ SOLN
INTRAMUSCULAR | Status: DC | PRN
Start: 2017-10-21 — End: 2017-10-21
  Administered 2017-10-21: 10 mL via EPIDURAL

## 2017-10-21 MED ORDER — IBUPROFEN 800 MG PO TABS
ORAL_TABLET | ORAL | Status: AC
  Administered 2017-10-21: 800 mg via ORAL
  Filled 2017-10-21: qty 1

## 2017-10-21 MED ORDER — LIDOCAINE-EPINEPHRINE (PF) 1.5 %-1:200000 IJ SOLN
INTRAMUSCULAR | Status: DC | PRN
  Administered 2017-10-21: 3 mL via PERINEURAL

## 2017-10-21 MED ORDER — ZOLPIDEM TARTRATE 5 MG PO TABS: 5.0000 mg | ORAL_TABLET | Freq: Every evening | ORAL | Status: DC | PRN

## 2017-10-21 MED ORDER — BUTORPHANOL TARTRATE 2 MG/ML IJ SOLN
INTRAMUSCULAR | Status: AC
  Administered 2017-10-21: 1 mg via INTRAVENOUS
  Filled 2017-10-21: qty 1

## 2017-10-21 MED ORDER — LIDOCAINE HCL (PF) 1 % IJ SOLN: 30.0000 mL | INTRAMUSCULAR | Status: DC | PRN

## 2017-10-21 MED ORDER — OXYCODONE-ACETAMINOPHEN 5-325 MG PO TABS: 1.0000 | ORAL_TABLET | ORAL | Status: DC | PRN

## 2017-10-21 MED ORDER — LACTATED RINGERS IV SOLN: 500.0000 mL | Freq: Once | INTRAVENOUS | Status: DC

## 2017-10-21 MED ORDER — EPHEDRINE 5 MG/ML INJ
10.0000 mg | INTRAVENOUS | Status: DC | PRN
Start: 2017-10-21 — End: 2017-10-21

## 2017-10-21 MED ORDER — LACTATED RINGERS IV SOLN: 500.0000 mL | INTRAVENOUS | Status: DC | PRN

## 2017-10-21 MED ORDER — DIPHENHYDRAMINE HCL 25 MG PO CAPS: 25.0000 mg | ORAL_CAPSULE | Freq: Four times a day (QID) | ORAL | Status: DC | PRN

## 2017-10-21 MED ORDER — ACETAMINOPHEN 325 MG PO TABS: 650.0000 mg | ORAL_TABLET | ORAL | Status: DC | PRN

## 2017-10-21 MED ORDER — EPHEDRINE 5 MG/ML INJ: 10.0000 mg | INTRAVENOUS | Status: DC | PRN

## 2017-10-21 MED ORDER — WITCH HAZEL-GLYCERIN EX PADS: 1.0000 "application " | MEDICATED_PAD | CUTANEOUS | Status: DC | PRN

## 2017-10-21 MED ORDER — PHENYLEPHRINE 40 MCG/ML (10ML) SYRINGE FOR IV PUSH (FOR BLOOD PRESSURE SUPPORT)
80.0000 ug | PREFILLED_SYRINGE | INTRAVENOUS | Status: DC | PRN
Start: 2017-10-21 — End: 2017-10-21

## 2017-10-21 MED ORDER — LIDOCAINE HCL (PF) 1 % IJ SOLN
INTRAMUSCULAR | Status: DC | PRN
  Administered 2017-10-21: 3 mL

## 2017-10-21 MED ORDER — SODIUM CHLORIDE 0.9 % IV SOLN
1.0000 g | INTRAVENOUS | Status: DC | PRN
  Administered 2017-10-21: 1 g via INTRAVENOUS
  Filled 2017-10-21: qty 1000

## 2017-10-21 MED ORDER — ONDANSETRON HCL 4 MG PO TABS: 4.0000 mg | ORAL_TABLET | ORAL | Status: DC | PRN

## 2017-10-21 MED ORDER — SODIUM CHLORIDE 0.9 % IV SOLN
2.0000 g | Freq: Once | INTRAVENOUS | Status: AC
  Administered 2017-10-21: 2 g via INTRAVENOUS

## 2017-10-21 MED ORDER — HYDROCODONE-ACETAMINOPHEN 5-325 MG PO TABS
1.0000 | ORAL_TABLET | Freq: Four times a day (QID) | ORAL | Status: DC | PRN
  Administered 2017-10-21 – 2017-10-22 (×3): 2 via ORAL
  Filled 2017-10-21 (×3): qty 2

## 2017-10-21 MED ORDER — FENTANYL 2.5 MCG/ML W/ROPIVACAINE 0.15% IN NS 100 ML EPIDURAL (ARMC)
12.0000 mL/h | EPIDURAL | Status: DC
  Administered 2017-10-21: 12 mL/h via EPIDURAL

## 2017-10-21 MED ORDER — MEASLES, MUMPS & RUBELLA VAC ~~LOC~~ INJ
0.5000 mL | INJECTION | Freq: Once | SUBCUTANEOUS | Status: AC
  Administered 2017-10-23: 0.5 mL via SUBCUTANEOUS
  Filled 2017-10-21 (×2): qty 0.5

## 2017-10-21 MED ORDER — OXYTOCIN 40 UNITS IN LACTATED RINGERS INFUSION - SIMPLE MED
2.5000 [IU]/h | INTRAVENOUS | Status: DC
  Administered 2017-10-21: 2.5 [IU]/h via INTRAVENOUS
  Filled 2017-10-21: qty 1000

## 2017-10-21 MED ORDER — PHENYLEPHRINE 40 MCG/ML (10ML) SYRINGE FOR IV PUSH (FOR BLOOD PRESSURE SUPPORT): 80.0000 ug | PREFILLED_SYRINGE | INTRAVENOUS | Status: DC | PRN

## 2017-10-21 MED ORDER — SIMETHICONE 80 MG PO CHEW
80.0000 mg | CHEWABLE_TABLET | ORAL | Status: DC | PRN
  Filled 2017-10-21: qty 1

## 2017-10-21 MED ORDER — TETANUS-DIPHTH-ACELL PERTUSSIS 5-2.5-18.5 LF-MCG/0.5 IM SUSP: 0.5000 mL | Freq: Once | INTRAMUSCULAR | Status: DC

## 2017-10-21 MED ORDER — OXYTOCIN BOLUS FROM INFUSION
500.0000 mL | Freq: Once | INTRAVENOUS | Status: DC
  Administered 2017-10-21: 500 mL via INTRAVENOUS

## 2017-10-21 MED ORDER — ONDANSETRON HCL 4 MG/2ML IJ SOLN: 4.0000 mg | Freq: Four times a day (QID) | INTRAMUSCULAR | Status: DC | PRN

## 2017-10-21 MED ORDER — BENZOCAINE-MENTHOL 20-0.5 % EX AERO: 1.0000 "application " | INHALATION_SPRAY | CUTANEOUS | Status: DC | PRN

## 2017-10-21 MED ORDER — SENNOSIDES-DOCUSATE SODIUM 8.6-50 MG PO TABS
2.0000 | ORAL_TABLET | ORAL | Status: DC
  Administered 2017-10-21 – 2017-10-23 (×2): 2 via ORAL
  Filled 2017-10-21 (×2): qty 2

## 2017-10-21 MED ORDER — FENTANYL 2.5 MCG/ML W/ROPIVACAINE 0.15% IN NS 100 ML EPIDURAL (ARMC)
EPIDURAL | Status: AC
  Filled 2017-10-21: qty 100

## 2017-10-21 MED ORDER — PRENATAL MULTIVITAMIN CH
1.0000 | ORAL_TABLET | Freq: Every day | ORAL | Status: DC
  Administered 2017-10-21 – 2017-10-22 (×2): 1 via ORAL
  Filled 2017-10-21 (×2): qty 1

## 2017-10-21 MED ORDER — BUTORPHANOL TARTRATE 2 MG/ML IJ SOLN
1.0000 mg | INTRAMUSCULAR | Status: DC | PRN
  Administered 2017-10-21: 1 mg via INTRAVENOUS

## 2017-10-21 MED ORDER — OXYCODONE-ACETAMINOPHEN 5-325 MG PO TABS: 2.0000 | ORAL_TABLET | ORAL | Status: DC | PRN

## 2017-10-21 MED ORDER — ONDANSETRON HCL 4 MG/2ML IJ SOLN: 4.0000 mg | INTRAMUSCULAR | Status: DC | PRN

## 2017-10-21 MED ORDER — LACTATED RINGERS IV SOLN
INTRAVENOUS | Status: DC
  Administered 2017-10-21 (×2): via INTRAVENOUS

## 2017-10-21 NOTE — Progress Notes (Signed)
Dr Valentino Saxonherry called and made aware of pt refusing lab draw, order received for Stadol 1-2mg  q 2 hrs.

## 2017-10-21 NOTE — Progress Notes (Signed)
Late entry following Epic downtime and daylight saving time adjustment this shift.   80250 - Pt arrived from ER via stretcher with family and child present. Reports SROM at home at 0132, clear with bloody show. Confirms +FM. Having regular painful labor contractions and GBS pos. Pt requesting Epidural for pain relief, then refusing to be checked. Other RN in room to assist, SVE per T. Su Hiltoberts, 7cm. Dr Valentino Saxonherry called and made aware, order to admit received.

## 2017-10-21 NOTE — Progress Notes (Signed)
Lab staff in room to obtain labwork, pt states she will not allow anyone to get lab until she gets her Epidural, explained to pt that lab results will be needed to obtained before anesthesia will place Epidural, pt still refusing lab draw.

## 2017-10-21 NOTE — Clinical Social Work Maternal (Signed)
CLINICAL SOCIAL WORK MATERNAL/CHILD NOTE  Patient Details  Name: Eileen Moody MRN: [id number] Date of Birth: [birth year]  Date:  10/21/2017  Clinical Social Worker Initiating Note:  Santiago Bumpers, MSW, [place] DP Date/Time: Initiated:  10/21/17/1559     Child's Name:  T.J. Owens Shark   Biological Parents:  Mother, Father   Need for Interpreter:  None   Reason for Referral:  Late or No Prenatal Care    Address:  [address] [place] [address]    Phone number:  [phone] (home)     Additional phone number: [phone]  Household Members/Support Persons (HM/SP):   Household Member/Support Person 1, Household Member/Support Person 2, Household Member/Support Person 3   HM/SP Name Relationship DOB or Age  HM/SP -1 [name] FOB 29  HM/SP -2 Ah'zhir Therapist, occupational minor child 2  HM/SP -3 Auri Pressnell minor child 11 months  HM/SP -4        HM/SP -5        HM/SP -6        HM/SP -7        HM/SP -8          Natural Supports (not living in the home):  Parent, Extended Family, Friends, Immediate Family, Radiographer, therapeutic Supports:   None  Employment: Animator   Type of Work: [employer]   Education:  High school graduate   Homebound arranged:  Psychiatric nurse by [employer] Resources:  Medicaid   Other Resources:  Physicist, medical , [place] Considerations Which May Impact Care:  The patient indicates strong ties to her church family.  Strengths:  Ability to meet basic needs , Compliance with medical plan , Home prepared for child , Understanding of illness, Pediatrician chosen   Psychotropic Medications:       None  Pediatrician:    [facility]  Pediatrician List:   [place])  [facility]      Pediatrician Fax Number:    Risk Factors/Current Problems:  Family/Relationship Issues    Cognitive State:  Able to Concentrate , Alert ,  Linear Thinking , Insightful , Goal Oriented    Mood/Affect:  Bright , Calm , Relaxed    CSW Assessment: CSW met with the patient at bedside to discuss barriers to prenatal care. The patient had entered care late in her pregnancy as she was not aware of pregnancy until 19 weeks due to continued spotting. The patient was formerly at [facility] in [place] for her care as well as the care of her children. She is in the process of transferring all care including her infant's to the [facility]. She is also planning on contacting DSS in the morning to transfer her [facility] benefits to [facility] from [place]. The patient has an appropriate car seat as well as all needed materials for her child. The patient has already investigated child care options so that she can return to work. The patient indicates strong support from her family, friends, and church. She has some discord with the FOB; however, she feels that she "can survive just fine if he leaves." According to the chart, the patient has doubts about the FOB's fidelity and has had multiple STD tests that have all been negative.  The patient has chosen a post-partum contraceptive. The CSW provided psychoeducation about post-partum depression, post-partum anxiety, and multiparity stress. The  patient was able to verbalize the s/s of all three in her own words as well as a plan should any arise: "I will go to my doctor and talk about how I am feeling." The patient was also receptive to self-care information for mothers of multiple children as her children as so close in age. The patient plans to monitor her sleep, nutrition, and stress to prevent maternal burnout/caregiver fatigue.   The CSW is signing off as the patient has no further needs at this time. Please consult should new needs arise.  CSW Plan/Description:  No Further Intervention Required/No Barriers to Discharge    Zettie Pho, LCSW 10/21/2017, 4:04 PM

## 2017-10-21 NOTE — Clinical Social Work Note (Signed)
CSW received consult for limited prenatal care. CSW will visit the patient once she has transferred to a room on M/B.  Argentina PonderKaren Martha Starleen Trussell, MSW, Theresia MajorsLCSWA [phone]

## 2017-10-21 NOTE — H&P (Signed)
Obstetric History and Physical  Oveda Dadamo is a 21 y.o. [id number] with IUP at [redacted]w[redacted]d presenting for complaints of contractions since 0100 this morning. Patient states she has been having  regular, every 3-4 minutes contractions, none vaginal bleeding, intact membranes, with active fetal movement.    Prenatal Course Source of Care: [facility]  with onset of care at 23 weeks.  Pregnancy complications or risks: Patient Active Problem List   Diagnosis Date Noted  . Normal labor 10/21/2017  . Tobacco use affecting pregnancy, antepartum 10/21/2017  . Encounter for supervision of normal pregnancy in teen primigravida, antepartum 10/21/2017  . History of prior pregnancy with intrauterine growth restricted newborn 10/21/2017  . History of miscarriage, currently pregnant 10/21/2017  . Limited prenatal care 10/21/2017  . Short interval between pregnancies affecting pregnancy, antepartum 10/21/2017   She plans to breastfeed She desires Nexplanon for postpartum contraception.   Prenatal labs and studies: ABO, Rh: --/--/A POS (11/04 0411) Antibody: NEG (11/04 0411) Rubella:  Non-Immune (07/26) RPR:   Pending  HBsAg:  Negative (07/26) HIV:  Pending GBS: Positive  1 hr Glucola  Normal Genetic screening declined Anatomy US normal   OB History  Gravida Para Term Preterm AB Living  4 2 2   1 2   SAB TAB Ectopic Multiple Live Births  1       2    # Outcome Date GA Lbr Len/2nd Weight Sex Delivery Anes PTL Lv  4 Current           3 Term 2017 [redacted]w[redacted]d  6 lb 12 oz (3.062 kg)  Vag-Spont  N LIV  2 SAB 2015 [redacted]w[redacted]d         1 Term 2014 [redacted]w[redacted]d  6 lb 14 oz (3.118 kg)  Vag-Spont   LIV      Past Medical History:  Diagnosis Date  . History of anemia    during pregnancy  . STD (sexually transmitted disease) 2015   H/o gonorrhea/chlamydia.  Treated    Past Surgical History:  Procedure Laterality Date  . NO PAST SURGERIES      Family History  Problem Relation Age of Onset  . Diabetes Neg Hx    . Hypertension Neg Hx   . Cancer Neg Hx     Social History   Socioeconomic History  . Marital status: Single    Spouse name: [name]  . Number of children: 2  . Years of education: None  . Highest education level: None  Social Needs  . Financial resource strain: None  . Food insecurity - worry: None  . Food insecurity - inability: None  . Transportation needs - medical: None  . Transportation needs - non-medical: None  Occupational History  . None  Tobacco Use  . Smoking status: Current Every Day Smoker    Packs/day: 1.00    Types: Cigarettes  . Smokeless tobacco: Never Used  Substance and Sexual Activity  . Alcohol use: No  . Drug use: None  . Sexual activity: Yes    Birth control/protection: None  Other Topics Concern  . None  Social History Narrative  . None    Medications Prior to Admission  Medication Sig Dispense Refill Last Dose  . Prenatal Multivit-Min-Fe-FA (PRENATAL VITAMINS PO) Take by mouth.   10/20/2017 at Unknown time    No Known Allergies  Review of Systems: Negative except for what is mentioned in HPI.  Physical Exam: BP 104/69 (BP Location: Left Arm)   Pulse 91   Temp 97.9  F (36.6 C) (Oral)   Resp 18   Ht 5\' 5"  (1.651 m)   Wt 204 lb (92.5 kg)   SpO2 99%   BMI 33.95 kg/m  CONSTITUTIONAL: Well-developed, well-nourished female in no acute distress.  HENT:  Normocephalic, atraumatic, External right and left ear normal. Oropharynx is clear and moist EYES: Conjunctivae and EOM are normal. Pupils are equal, round, and reactive to light. No scleral icterus.  NECK: Normal range of motion, supple, no masses SKIN: Skin is warm and dry. No rash noted. Not diaphoretic. No erythema. No pallor. NEUROLOGIC: Alert and oriented to person, place, and time. Normal reflexes, muscle tone coordination. No cranial nerve deficit noted. PSYCHIATRIC: Normal mood and affect. Normal behavior. Normal judgment and thought content. CARDIOVASCULAR: Normal heart rate  noted, regular rhythm RESPIRATORY: Effort and breath sounds normal, no problems with respiration noted ABDOMEN: Soft, nontender, nondistended, gravid. MUSCULOSKELETAL: Normal range of motion. No edema and no tenderness. 2+ distal pulses.  Cervical Exam: Dilatation 10cm   Effacement 100%   Station 0 to +1.  Bulging bag present. (Was 7/100/-1/with bulging bag at admission).   Presentation: cephalic FHT:  Baseline rate 135 bpm   Variability moderate  Accelerations present   Decelerations none Contractions: Every 2-3 mins   Pertinent Labs/Studies:   Results for orders placed or performed during the hospital encounter of 10/21/17 (from the past 24 hour(s))  CBC with Differential/Platelet     Status: Abnormal   Collection Time: 10/21/17  4:11 AM  Result Value Ref Range   WBC 13.6 (H) 3.6 - 11.0 K/uL   RBC 3.87 3.80 - 5.20 MIL/uL   Hemoglobin 10.9 (L) 12.0 - 16.0 g/dL   HCT 16.133.8 (L) 09.635.0 - 04.547.0 %   MCV 87.2 80.0 - 100.0 fL   MCH 28.0 26.0 - 34.0 pg   MCHC 32.1 32.0 - 36.0 g/dL   RDW 40.914.2 81.111.5 - 91.414.5 %   Platelets 224 150 - 440 K/uL   Neutrophils Relative % 70 %   Neutro Abs 9.6 (H) 1.4 - 6.5 K/uL   Lymphocytes Relative 17 %   Lymphs Abs 2.3 1.0 - 3.6 K/uL   Monocytes Relative 10 %   Monocytes Absolute 1.3 (H) 0.2 - 0.9 K/uL   Eosinophils Relative 3 %   Eosinophils Absolute 0.4 0 - 0.7 K/uL   Basophils Relative 0 %   Basophils Absolute 0.1 0 - 0.1 K/uL  Type and screen     Status: None   Collection Time: 10/21/17  4:11 AM  Result Value Ref Range   ABO/RH(D) A POS    Antibody Screen NEG    Sample Expiration 10/24/2017   Chlamydia/NGC rt PCR ([facility] only)     Status: None   Collection Time: 10/21/17  4:40 AM  Result Value Ref Range   Specimen source GC/Chlam URINE, RANDOM    Chlamydia Tr NOT DETECTED NOT DETECTED   N gonorrhoeae NOT DETECTED NOT DETECTED    Assessment : Danielle Rankinateyena Vandalen is a 21 y.o. G3P2002 at 7785w1d being admitted for labor.  Limited prenatal care.    Plan: Labor: Expectant management.  AROM performed.  Anticipate vaginal delivery.  Has epidural in place.  FWB: Reassuring fetal heart tracing.  GBS positive.  Has received 2 doses of ampicillin IV.  Delivery plan: Hopeful for vaginal delivery soon.    Hildred Laserherry, Kynsley Whitehouse, MD [facility]

## 2017-10-21 NOTE — Progress Notes (Signed)
Dr Valentino Saxonherry called, made aware, abx dose # 2 infusion complete, provided update, current variables, on her way.

## 2017-10-21 NOTE — Anesthesia Procedure Notes (Signed)
Epidural Patient location during procedure: OB Start time: 10/21/2017 5:24 AM End time: 10/21/2017 5:35 AM  Staffing Anesthesiologist: Yves Dillarroll, Neri Samek, MD Performed: anesthesiologist   Preanesthetic Checklist Completed: patient identified, site marked, surgical consent, pre-op evaluation, timeout performed, IV checked, risks and benefits discussed and monitors and equipment checked  Epidural Patient position: sitting Prep: Betadine Patient monitoring: heart rate, cardiac monitor, continuous pulse ox and blood pressure Approach: midline Location: L3-L4 Injection technique: LOR air  Needle:  Needle type: Tuohy  Needle gauge: 17 G Needle length: 9 cm Catheter type: closed end flexible Catheter size: 19 Gauge Test dose: negative and 1.5% lidocaine with Epi 1:200 K  Assessment Sensory level: T8  Additional Notes Time out called.  Patient placed in sitting position.  Back prepped and draped in sterile fashion.  A skin wheal was made in the L3-L4 interspace with 1% Lidocaine plain.  A 17 G Tuohy needle was advanced to the epidural space by a loss of resistance technique .  The epidural catheter was advanced 3 cm into the epidural space with a negative TD.  The catheter was affixed to the back in sterile fashion..  The patient tolerated the procedure well.

## 2017-10-21 NOTE — ED Provider Notes (Signed)
This is a 21 year old female who comes into the hospital today full-term with some abdominal pain, back pain and the urge to push. The patient broke her water approximately one hour prior to her arrival to the emergency department. We did pull a patient out of her vehicle after determining that she was not actively pushing out her child. This is the patient's third baby. The patient was brought to room 14 where we laid her on the stretcher and did a vaginal exam. At that time the patient was not crowning but did have some bloody show. On exam the baby's head was felt to be still at the level of the cervix. We felt that the patient was able to be taken up to labor and delivery for delivery of her baby.  The patient was then taken up to labor and delivery for further evaluation.   Rebecka ApleyWebster, Roderick Calo P, MD 10/21/17 [phone]

## 2017-10-21 NOTE — Anesthesia Preprocedure Evaluation (Signed)
Anesthesia Evaluation  Patient identified by MRN, date of birth, ID band Patient awake    Reviewed: Allergy & Precautions, NPO status , Patient's Chart, lab work & pertinent test results  Airway Mallampati: II  TM Distance: >3 FB     Dental no notable dental hx.    Pulmonary Current Smoker,    Pulmonary exam normal        Cardiovascular negative cardio ROS Normal cardiovascular exam     Neuro/Psych negative neurological ROS  negative psych ROS   GI/Hepatic negative GI ROS, Neg liver ROS,   Endo/Other  negative endocrine ROS  Renal/GU negative Renal ROS  negative genitourinary   Musculoskeletal negative musculoskeletal ROS (+)   Abdominal Normal abdominal exam  (+)   Peds negative pediatric ROS (+)  Hematology negative hematology ROS (+)   Anesthesia Other Findings   Reproductive/Obstetrics (+) Pregnancy                             Anesthesia Physical Anesthesia Plan  ASA: II  Anesthesia Plan: Epidural   Post-op Pain Management:    Induction:   PONV Risk Score and Plan: 1  Airway Management Planned: Nasal Cannula  Additional Equipment:   Intra-op Plan:   Post-operative Plan:   Informed Consent: I have reviewed the patients History and Physical, chart, labs and discussed the procedure including the risks, benefits and alternatives for the proposed anesthesia with the patient or authorized representative who has indicated his/her understanding and acceptance.   Dental advisory given  Plan Discussed with: CRNA and Surgeon  Anesthesia Plan Comments:         Anesthesia Quick Evaluation

## 2017-10-22 LAB — CBC
HCT: 27.8 % — ABNORMAL LOW (ref 35.0–47.0)
HEMOGLOBIN: 9 g/dL — AB (ref 12.0–16.0)
MCH: 28.2 pg (ref 26.0–34.0)
MCHC: 32.5 g/dL (ref 32.0–36.0)
MCV: 86.9 fL (ref 80.0–100.0)
Platelets: 194 10*3/uL (ref 150–440)
RBC: 3.19 MIL/uL — AB (ref 3.80–5.20)
RDW: 14.2 % (ref 11.5–14.5)
WBC: 13.5 10*3/uL — AB (ref 3.6–11.0)

## 2017-10-22 MED ORDER — DOCUSATE SODIUM 100 MG PO CAPS: 100.0000 mg | ORAL_CAPSULE | Freq: Two times a day (BID) | ORAL | 2 refills | Status: DC | PRN

## 2017-10-22 MED ORDER — ACETAMINOPHEN 325 MG PO TABS: 650.0000 mg | ORAL_TABLET | Freq: Four times a day (QID) | ORAL | 1 refills | Status: DC | PRN

## 2017-10-22 MED ORDER — FERROUS SULFATE 325 (65 FE) MG PO TABS
325.0000 mg | ORAL_TABLET | Freq: Two times a day (BID) | ORAL | 1 refills | Status: DC
Start: 2017-10-22 — End: 2022-05-23

## 2017-10-22 MED ORDER — IBUPROFEN 800 MG PO TABS: 800.0000 mg | ORAL_TABLET | Freq: Three times a day (TID) | ORAL | 1 refills | Status: DC | PRN

## 2017-10-22 NOTE — Discharge Instructions (Signed)

## 2017-10-22 NOTE — Progress Notes (Addendum)
Post Partum Day # 1, s/p SVD  Subjective: no complaints, up ad lib, voiding and tolerating PO  Objective: Temp:  [98 F (36.7 C)-98.1 F (36.7 C)] 98 F (36.7 C) (11/05 0757) Pulse Rate:  [83-88] 88 (11/05 0757) Resp:  [18-20] 18 (11/05 0757) BP: (107-132)/(51-69) 110/63 (11/05 0757) SpO2:  [98 %-99 %] 98 % (11/05 0757)  Physical Exam:  General: alert and no distress  Lungs: clear to auscultation bilaterally Breasts: normal appearance, no masses or tenderness Heart: regular rate and rhythm, S1, S2 normal, no murmur, click, rub or gallop Abdomen: soft, non-tender; bowel sounds normal; no masses,  no organomegaly Pelvis: Lochia: appropriate, Uterine Fundus: firm Extremities: DVT Evaluation: No evidence of DVT seen on physical exam. Negative Homan's sign. No cords or calf tenderness. No significant calf/ankle edema.  Recent Labs    10/21/17 0411 10/22/17 0602  HGB 10.9* 9.0*  HCT 33.8* 27.8*    Assessment/Plan: Doing well postpartum Anemia of pregnancy, mildly worsened after delivery. Can take iron tablets daily in addition to PNV with iron.  Breastfeeding, Lactation consult  S/p Social work consult for late entry to care and limited PNC Contraception: Nexplanon  MMR needed postpartum Continue routine care Plan for discharge tomorrow     LOS: 1 day   Rubie Maid, MD [facility]

## 2017-10-22 NOTE — Discharge Summary (Addendum)
Obstetric Discharge Summary Reason for Admission: onset of labor Prenatal Procedures: ultrasound Intrapartum Procedures: spontaneous vaginal delivery Postpartum Procedures: Rubella Ig Complications-Operative and Postpartum: none   Hemoglobin  Date Value Ref Range Status  10/22/2017 9.0 (L) 12.0 - 16.0 g/dL Final   HGB  Date Value Ref Range Status  07/29/2013 11.6 (L) 12.0 - 16.0 g/dL Final   HCT  Date Value Ref Range Status  10/22/2017 27.8 (L) 35.0 - 47.0 % Final  07/29/2013 33.2 (L) 35.0 - 47.0 % Final    Physical Exam:  Vitals:   10/22/17 0757 10/22/17 1300 10/22/17 1423 10/22/17 1634  BP: 110/63  130/75   Pulse: 88  (!) 122   Resp: 18  20   Temp: 98 F (36.7 C) 98 F (36.7 C) 98.6 F (37 C) 98.2 F (36.8 C)  TempSrc: Oral Oral Oral Oral  SpO2: 98%  98%   Weight:      Height:       General: alert and no distress Lochia: appropriate Uterine Fundus: firm Incision: None DVT Evaluation: No evidence of DVT seen on physical exam. Negative Homan's sign. No cords or calf tenderness. No significant calf/ankle edema.  Discharge Diagnoses: [facility]  Discharge Information: Date: 10/22/2017 Activity: pelvic rest Diet: routine Medications: PNV, Ibuprofen, Colace, Iron and Tylenol Condition: stable Instructions: refer to practice specific booklet Discharge to: home Follow-up Information    Eileen Moody, Eileen Boyar, Eileen Moody Follow up.   Specialties:  Obstetrics and Gynecology, Radiology Why:  4-6 weeks for postpartum exam and Nexplanon insertion Contact information: [address] [place] [address] [phone]           Newborn Data: Live born female  Birth Weight: 7 lb 4.4 oz (3300 g) APGAR: 8, 9  Newborn Delivery   Birth date/time:  10/21/2017 08:03:00 Delivery type:  Vaginal, Spontaneous     Home with mother.  Eileen Lasernika Jaydynn Moody 10/22/2017, 5:32 PM

## 2017-10-23 LAB — RPR: RPR: NONREACTIVE

## 2017-10-23 NOTE — Anesthesia Postprocedure Evaluation (Signed)
Anesthesia Post Note  Patient: Danielle Rankinateyena Plass  Procedure(s) Performed: AN AD HOC LABOR EPIDURAL  Patient location during evaluation: Mother Baby Anesthesia Type: Epidural Level of consciousness: awake, awake and alert and oriented Pain management: pain level controlled Vital Signs Assessment: post-procedure vital signs reviewed and stable Respiratory status: spontaneous breathing Cardiovascular status: blood pressure returned to baseline Postop Assessment: no headache, no backache, no apparent nausea or vomiting and adequate PO intake Anesthetic complications: no     Last Vitals:  Vitals:   10/22/17 1634 10/22/17 1940  BP:  132/62  Pulse:  88  Resp:  20  Temp: 36.8 C 36.8 C  SpO2:  98%    Last Pain:  Vitals:   10/23/17 0559  TempSrc:   PainSc: Asleep                 Karoline Caldwelleana Maimouna Rondeau

## 2017-11-12 DIAGNOSIS — Z30017 Encounter for initial prescription of implantable subdermal contraceptive: Secondary | ICD-10-CM | POA: Insufficient documentation

## 2017-11-13 ENCOUNTER — Encounter: Payer: Self-pay | Admitting: Obstetrics and Gynecology

## 2017-11-13 ENCOUNTER — Ambulatory Visit (INDEPENDENT_AMBULATORY_CARE_PROVIDER_SITE_OTHER): Payer: Medicaid Other | Admitting: Obstetrics and Gynecology

## 2017-11-13 DIAGNOSIS — Z30017 Encounter for initial prescription of implantable subdermal contraceptive: Secondary | ICD-10-CM

## 2017-11-13 LAB — POCT URINE PREGNANCY: Preg Test, Ur: NEGATIVE

## 2017-11-13 NOTE — Progress Notes (Signed)
     GYNECOLOGY OFFICE PROCEDURE NOTE  Eileen Moody is a 21 y.o. [phone] here for Nexplanon insertion. Patient has never had a pap smear.  No other gynecologic concerns. She is currently 3 weeks postpartum.   Nexplanon Insertion Procedure Patient identified, informed consent performed, consent signed.   Patient does understand that irregular bleeding is a very common side effect of this medication. She was advised to have backup contraception for one week after placement. Pregnancy test in clinic today was negative.  Appropriate time out taken.  Patient's left arm was prepped and draped in the usual sterile fashion. The ruler used to measure and mark insertion area.  Patient was prepped with alcohol swab and then injected with 3 ml of 1% lidocaine.  She was prepped with betadine, Nexplanon removed from packaging,  Device confirmed in needle, then inserted full length of needle and withdrawn per handbook instructions. Nexplanon was able to palpated in the patient's arm; patient palpated the insert herself. There was minimal blood loss.  Patient insertion site covered with guaze and a pressure bandage to reduce any bruising.  The patient tolerated the procedure well and was given post procedure instructions.    Exp: 12/2019 Lot: [id number]   Hildred Laserherry, Tamsen Reist, MD [facility]

## 2017-11-13 NOTE — Patient Instructions (Signed)
NEXPLANON PLACEMENT POST-PROCEDURE INSTRUCTIONS  1. You may take Ibuprofen, Aleve or Tylenol for pain if needed.  Pain should resolve within in 24 hours.  2. You may have intercourse after 24 hours.  If you using this for birth control, it is effective immediately.  3. You need to call if you have any fever, heavy bleeding, or redness at insertion site. Irregular bleeding is common the first several months after having a Nexplanonplaced. You do not need to call for this reason unless you are concerned.  4. Shower or bathe as normal.  You can remove the bandage after 24 hours. 5.   

## 2018-03-09 ENCOUNTER — Emergency Department
Admission: EM | Admit: 2018-03-09 | Discharge: 2018-03-09 | Disposition: A | Discharge status: Home / Self Care | Payer: Medicaid Other | Attending: Emergency Medicine | Admitting: Emergency Medicine

## 2018-03-09 ENCOUNTER — Other Ambulatory Visit: Payer: Self-pay

## 2018-03-09 ENCOUNTER — Encounter: Payer: Self-pay | Admitting: *Deleted

## 2018-03-09 DIAGNOSIS — F321 Major depressive disorder, single episode, moderate: Secondary | ICD-10-CM | POA: Insufficient documentation

## 2018-03-09 DIAGNOSIS — Z046 Encounter for general psychiatric examination, requested by authority: Secondary | ICD-10-CM | POA: Diagnosis present

## 2018-03-09 DIAGNOSIS — F17210 Nicotine dependence, cigarettes, uncomplicated: Secondary | ICD-10-CM | POA: Insufficient documentation

## 2018-03-09 DIAGNOSIS — R45851 Suicidal ideations: Secondary | ICD-10-CM | POA: Insufficient documentation

## 2018-03-09 DIAGNOSIS — Z79899 Other long term (current) drug therapy: Secondary | ICD-10-CM | POA: Insufficient documentation

## 2018-03-09 DIAGNOSIS — T7611XA Adult physical abuse, suspected, initial encounter: Secondary | ICD-10-CM | POA: Diagnosis not present

## 2018-03-09 DIAGNOSIS — Z644 Discord with counselors: Secondary | ICD-10-CM | POA: Insufficient documentation

## 2018-03-09 LAB — COMPREHENSIVE METABOLIC PANEL
ALBUMIN: 4.3 g/dL (ref 3.5–5.0)
ALT: 28 U/L (ref 14–54)
AST: 30 U/L (ref 15–41)
Alkaline Phosphatase: 68 U/L (ref 38–126)
Anion gap: 8 (ref 5–15)
BUN: 14 mg/dL (ref 6–20)
CO2: 22 mmol/L (ref 22–32)
Calcium: 9.1 mg/dL (ref 8.9–10.3)
Chloride: 109 mmol/L (ref 101–111)
Creatinine, Ser: 0.96 mg/dL (ref 0.44–1.00)
GFR calc non Af Amer: >60 mL/min (ref >60)
Glucose, Bld: 86 mg/dL (ref 65–99)
POTASSIUM: 3.8 mmol/L (ref 3.5–5.1)
SODIUM: 139 mmol/L (ref 135–145)
Total Bilirubin: 0.7 mg/dL (ref 0.3–1.2)
Total Protein: 8.1 g/dL (ref 6.5–8.1)

## 2018-03-09 LAB — CBC
HCT: 39.9 % (ref 35.0–47.0)
Hemoglobin: 13 g/dL (ref 12.0–16.0)
MCH: 28.7 pg (ref 26.0–34.0)
MCHC: 32.7 g/dL (ref 32.0–36.0)
MCV: 87.7 fL (ref 80.0–100.0)
PLATELETS: 269 10*3/uL (ref 150–440)
RBC: 4.54 MIL/uL (ref 3.80–5.20)
RDW: 13.7 % (ref 11.5–14.5)
WBC: 8.9 10*3/uL (ref 3.6–11.0)

## 2018-03-09 LAB — URINE DRUG SCREEN, QUALITATIVE (ARMC ONLY)
Amphetamines, Ur Screen: NOT DETECTED
Barbiturates, Ur Screen: NOT DETECTED
Benzodiazepine, Ur Scrn: NOT DETECTED
CANNABINOID 50 NG, UR ~~LOC~~: POSITIVE — AB
Cocaine Metabolite,Ur ~~LOC~~: NOT DETECTED
MDMA (Ecstasy)Ur Screen: NOT DETECTED
Methadone Scn, Ur: NOT DETECTED
Opiate, Ur Screen: NOT DETECTED
PHENCYCLIDINE (PCP) UR S: NOT DETECTED
TRICYCLIC, UR SCREEN: NOT DETECTED

## 2018-03-09 LAB — ACETAMINOPHEN LEVEL: Acetaminophen (Tylenol), Serum: <10 ug/mL — ABNORMAL LOW (ref 10–30)

## 2018-03-09 LAB — ETHANOL: Alcohol, Ethyl (B): <10 mg/dL (ref <10)

## 2018-03-09 LAB — POCT PREGNANCY, URINE: PREG TEST UR: NEGATIVE

## 2018-03-09 LAB — SALICYLATE LEVEL

## 2018-03-09 NOTE — ED Notes (Signed)
Patient refuses to speak with Clinical research associatewriter.

## 2018-03-09 NOTE — ED Notes (Signed)
SOC called for consult  [phone]
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
## 2018-03-09 NOTE — Discharge Instructions (Signed)
Please follow-up with [facility] and the other resources social work gave you. Return as needed.

## 2018-03-09 NOTE — ED Notes (Signed)
Pt denies SI/HI/AVH. Pt given discharge instructions including prescriptions, medication samples, and f/u appointments. Pt states understanding. Pt states receipt of all belongings.   
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
## 2019-02-22 ENCOUNTER — Emergency Department
Admission: EM | Admit: 2019-02-22 | Discharge: 2019-02-22 | Disposition: A | Discharge status: Home / Self Care | Payer: Medicaid Other | Attending: Emergency Medicine | Admitting: Emergency Medicine

## 2019-02-22 ENCOUNTER — Other Ambulatory Visit: Payer: Self-pay

## 2019-02-22 DIAGNOSIS — Z79899 Other long term (current) drug therapy: Secondary | ICD-10-CM | POA: Diagnosis not present

## 2019-02-22 DIAGNOSIS — R4689 Other symptoms and signs involving appearance and behavior: Secondary | ICD-10-CM | POA: Diagnosis present

## 2019-02-22 DIAGNOSIS — F17210 Nicotine dependence, cigarettes, uncomplicated: Secondary | ICD-10-CM | POA: Diagnosis not present

## 2019-02-22 DIAGNOSIS — F6381 Intermittent explosive disorder: Secondary | ICD-10-CM | POA: Diagnosis not present

## 2019-02-22 LAB — CBC WITH DIFFERENTIAL/PLATELET
ABS IMMATURE GRANULOCYTES: 0.03 10*3/uL (ref 0.00–0.07)
Basophils Absolute: 0.1 10*3/uL (ref 0.0–0.1)
Basophils Relative: 1 %
Eosinophils Absolute: 0.2 10*3/uL (ref 0.0–0.5)
Eosinophils Relative: 2 %
HCT: 42.8 % (ref 36.0–46.0)
HEMOGLOBIN: 14.3 g/dL (ref 12.0–15.0)
IMMATURE GRANULOCYTES: 0 %
LYMPHS PCT: 22 %
Lymphs Abs: 2.6 10*3/uL (ref 0.7–4.0)
MCH: 31 pg (ref 26.0–34.0)
MCHC: 33.4 g/dL (ref 30.0–36.0)
MCV: 92.6 fL (ref 80.0–100.0)
MONO ABS: 0.8 10*3/uL (ref 0.1–1.0)
MONOS PCT: 7 %
NEUTROS ABS: 8.3 10*3/uL — AB (ref 1.7–7.7)
NEUTROS PCT: 68 %
PLATELETS: 281 10*3/uL (ref 150–400)
RBC: 4.62 MIL/uL (ref 3.87–5.11)
RDW: 12.3 % (ref 11.5–15.5)
WBC: 12 10*3/uL — AB (ref 4.0–10.5)
nRBC: 0 % (ref 0.0–0.2)

## 2019-02-22 LAB — COMPREHENSIVE METABOLIC PANEL
ALT: 21 U/L (ref 0–44)
AST: 29 U/L (ref 15–41)
Albumin: 4.2 g/dL (ref 3.5–5.0)
Alkaline Phosphatase: 46 U/L (ref 38–126)
Anion gap: 10 (ref 5–15)
BILIRUBIN TOTAL: 0.9 mg/dL (ref 0.3–1.2)
BUN: 8 mg/dL (ref 6–20)
CO2: 21 mmol/L — ABNORMAL LOW (ref 22–32)
CREATININE: 0.95 mg/dL (ref 0.44–1.00)
Calcium: 8.8 mg/dL — ABNORMAL LOW (ref 8.9–10.3)
Chloride: 109 mmol/L (ref 98–111)
GFR calc Af Amer: >60 mL/min (ref >60)
Glucose, Bld: 92 mg/dL (ref 70–99)
Potassium: 3.3 mmol/L — ABNORMAL LOW (ref 3.5–5.1)
SODIUM: 140 mmol/L (ref 135–145)
TOTAL PROTEIN: 7.5 g/dL (ref 6.5–8.1)

## 2019-02-22 LAB — ACETAMINOPHEN LEVEL: Acetaminophen (Tylenol), Serum: <10 ug/mL — ABNORMAL LOW (ref 10–30)

## 2019-02-22 LAB — URINE DRUG SCREEN, QUALITATIVE (ARMC ONLY)
Amphetamines, Ur Screen: NOT DETECTED
Barbiturates, Ur Screen: NOT DETECTED
Benzodiazepine, Ur Scrn: NOT DETECTED
CANNABINOID 50 NG, UR ~~LOC~~: POSITIVE — AB
Cocaine Metabolite,Ur ~~LOC~~: NOT DETECTED
MDMA (ECSTASY) UR SCREEN: NOT DETECTED
Methadone Scn, Ur: NOT DETECTED
Opiate, Ur Screen: NOT DETECTED
PHENCYCLIDINE (PCP) UR S: NOT DETECTED
Tricyclic, Ur Screen: NOT DETECTED

## 2019-02-22 LAB — SALICYLATE LEVEL

## 2019-02-22 LAB — ETHANOL

## 2019-02-22 MED ORDER — LORAZEPAM 2 MG/ML IJ SOLN
2.0000 mg | Freq: Once | INTRAMUSCULAR | Status: AC
  Administered 2019-02-22: 2 mg via INTRAMUSCULAR
  Filled 2019-02-22: qty 1

## 2019-02-22 MED ORDER — DIPHENHYDRAMINE HCL 50 MG/ML IJ SOLN
50.0000 mg | Freq: Once | INTRAMUSCULAR | Status: AC
  Administered 2019-02-22: 50 mg via INTRAMUSCULAR
  Filled 2019-02-22: qty 1

## 2019-02-22 MED ORDER — ZIPRASIDONE MESYLATE 20 MG IM SOLR
20.0000 mg | Freq: Once | INTRAMUSCULAR | Status: AC
  Administered 2019-02-22: 20 mg via INTRAMUSCULAR
  Filled 2019-02-22 (×2): qty 20

## 2019-02-22 NOTE — ED Notes (Signed)
Pt. Called friend/family member to pick up patient in lobby.

## 2019-02-22 NOTE — ED Notes (Signed)
Geodon IM given by Durene Cal RN

## 2019-02-22 NOTE — ED Notes (Signed)
Pt being changed out now.

## 2019-02-22 NOTE — ED Notes (Signed)
Pt up to side of bed talking to tech

## 2019-02-22 NOTE — ED Notes (Signed)
This nurse and MD doctor went into room to talk to patient.  Pt. States she has a safe place to go to.  Pt. Denies SI/HI A/V hallucination.

## 2019-02-22 NOTE — ED Provider Notes (Signed)
 -----------------------------------------   9:35 PM on 02/22/2019 -----------------------------------------   Blood pressure 123/75, pulse 85, temperature 97.9 F (36.6 C), temperature source Oral, resp. rate 18, SpO2 100 %, unknown if currently breastfeeding.  The patient is calm and cooperative at this time.  There have been no acute events since the last update.    Psychiatry consult note reviewed which does find the patient to be psychiatrically stable and not requiring involuntary commitment or psychiatric hospitalization.  The patient is now awake, with linear thought clear speech and intact memory and rational decision-making.  She denies SI HI or hallucinations.  Does state that she had previously seen a mental health provider for depression and she is eager to follow-up with them again.  I will give her referral information for [facility].  At this time she is medically and psychiatrically stable, vital signs are normal, no acute complaints and suitable for discharge home.   Sharman Cheek, MD 02/22/19 2136
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
## 2019-02-22 NOTE — ED Notes (Signed)
Pt finished her soc and is sleeping
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
## 2019-02-22 NOTE — ED Notes (Signed)
Pt. Up and used bathroom.  Pt. Requesting to talk to doctor.

## 2019-02-22 NOTE — ED Notes (Signed)
Mother called and left her phone number [phone] Or her fiance [phone] when she awakes please call.
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
## 2019-03-21 ENCOUNTER — Other Ambulatory Visit: Payer: Self-pay

## 2019-03-21 ENCOUNTER — Encounter: Payer: Self-pay | Admitting: Emergency Medicine

## 2019-03-21 ENCOUNTER — Emergency Department
Admission: EM | Admit: 2019-03-21 | Discharge: 2019-03-23 | Disposition: A | Discharge status: Home / Self Care | Payer: Medicaid Other | Attending: Emergency Medicine | Admitting: Emergency Medicine

## 2019-03-21 DIAGNOSIS — F17210 Nicotine dependence, cigarettes, uncomplicated: Secondary | ICD-10-CM | POA: Insufficient documentation

## 2019-03-21 DIAGNOSIS — F4310 Post-traumatic stress disorder, unspecified: Secondary | ICD-10-CM

## 2019-03-21 DIAGNOSIS — Z91410 Personal history of adult physical and sexual abuse: Secondary | ICD-10-CM

## 2019-03-21 DIAGNOSIS — F39 Unspecified mood [affective] disorder: Secondary | ICD-10-CM | POA: Insufficient documentation

## 2019-03-21 DIAGNOSIS — R4689 Other symptoms and signs involving appearance and behavior: Secondary | ICD-10-CM

## 2019-03-21 DIAGNOSIS — Z79899 Other long term (current) drug therapy: Secondary | ICD-10-CM | POA: Insufficient documentation

## 2019-03-21 DIAGNOSIS — R451 Restlessness and agitation: Secondary | ICD-10-CM | POA: Insufficient documentation

## 2019-03-21 DIAGNOSIS — R456 Violent behavior: Secondary | ICD-10-CM | POA: Insufficient documentation

## 2019-03-21 DIAGNOSIS — Z91419 Personal history of unspecified adult abuse: Secondary | ICD-10-CM

## 2019-03-21 DIAGNOSIS — R45850 Homicidal ideations: Secondary | ICD-10-CM | POA: Diagnosis not present

## 2019-03-21 DIAGNOSIS — F1220 Cannabis dependence, uncomplicated: Secondary | ICD-10-CM

## 2019-03-21 DIAGNOSIS — F329 Major depressive disorder, single episode, unspecified: Secondary | ICD-10-CM | POA: Diagnosis not present

## 2019-03-21 LAB — BASIC METABOLIC PANEL
Anion gap: 8 (ref 5–15)
BUN: 9 mg/dL (ref 6–20)
CO2: 23 mmol/L (ref 22–32)
Calcium: 8.8 mg/dL — ABNORMAL LOW (ref 8.9–10.3)
Chloride: 109 mmol/L (ref 98–111)
Creatinine, Ser: 1.03 mg/dL — ABNORMAL HIGH (ref 0.44–1.00)
GFR calc Af Amer: >60 mL/min (ref >60)
GFR calc non Af Amer: >60 mL/min (ref >60)
Glucose, Bld: 80 mg/dL (ref 70–99)
Potassium: 3.7 mmol/L (ref 3.5–5.1)
Sodium: 140 mmol/L (ref 135–145)

## 2019-03-21 LAB — CBC
HCT: 39.2 % (ref 36.0–46.0)
Hemoglobin: 13.4 g/dL (ref 12.0–15.0)
MCH: 31.5 pg (ref 26.0–34.0)
MCHC: 34.2 g/dL (ref 30.0–36.0)
MCV: 92.2 fL (ref 80.0–100.0)
Platelets: 253 10*3/uL (ref 150–400)
RBC: 4.25 MIL/uL (ref 3.87–5.11)
RDW: 12.2 % (ref 11.5–15.5)
WBC: 10.8 10*3/uL — ABNORMAL HIGH (ref 4.0–10.5)
nRBC: 0 % (ref 0.0–0.2)

## 2019-03-21 LAB — HCG, QUANTITATIVE, PREGNANCY: hCG, Beta Chain, Quant, S: <1 m[IU]/mL (ref <5)

## 2019-03-21 LAB — SALICYLATE LEVEL: Salicylate Lvl: <7 mg/dL (ref 2.8–30.0)

## 2019-03-21 LAB — POCT PREGNANCY, URINE: Preg Test, Ur: NEGATIVE

## 2019-03-21 LAB — ETHANOL: Alcohol, Ethyl (B): <10 mg/dL (ref <10)

## 2019-03-21 LAB — ACETAMINOPHEN LEVEL: Acetaminophen (Tylenol), Serum: <10 ug/mL — ABNORMAL LOW (ref 10–30)

## 2019-03-21 MED ORDER — HALOPERIDOL LACTATE 5 MG/ML IJ SOLN
2.0000 mg | Freq: Once | INTRAMUSCULAR | Status: AC
  Administered 2019-03-21: 2 mg via INTRAMUSCULAR
  Filled 2019-03-21: qty 1

## 2019-03-21 MED ORDER — HALOPERIDOL 5 MG PO TABS
5.0000 mg | ORAL_TABLET | Freq: Once | ORAL | Status: AC
  Administered 2019-03-21: 5 mg via ORAL
  Filled 2019-03-21: qty 1

## 2019-03-21 MED ORDER — HALOPERIDOL LACTATE 5 MG/ML IJ SOLN
3.0000 mg | Freq: Once | INTRAMUSCULAR | Status: DC
  Filled 2019-03-21: qty 1

## 2019-03-21 MED ORDER — DIPHENHYDRAMINE HCL 50 MG/ML IJ SOLN
50.0000 mg | Freq: Once | INTRAMUSCULAR | Status: AC
  Administered 2019-03-21: 11:00:00 50 mg via INTRAVENOUS

## 2019-03-21 MED ORDER — DIPHENHYDRAMINE HCL 50 MG/ML IJ SOLN
INTRAMUSCULAR | Status: AC
  Administered 2019-03-21: 50 mg via INTRAVENOUS
  Filled 2019-03-21: qty 1

## 2019-03-21 MED ORDER — LORAZEPAM 2 MG/ML IJ SOLN
2.0000 mg | Freq: Once | INTRAMUSCULAR | Status: AC
  Administered 2019-03-21 – 2019-03-22 (×2): 2 mg via INTRAMUSCULAR
  Filled 2019-03-21: qty 1

## 2019-03-21 NOTE — ED Notes (Addendum)
Pt sitting on mattress at this time inside ED treatment room, pt asking this writer to use the phone, this writer explained to pt that phone hours are over and she would be allowed to call her mother in the morning during specific phone times, pt states "I'm trying to fucking be nice and I ain't going to keep being nice" RN notified

## 2019-03-21 NOTE — ED Notes (Signed)
Pt dressed out in wine colored scrubs.  Belongings included:  White t-shirt and pink and black shorts.

## 2019-03-21 NOTE — ED Notes (Signed)
IVC patient pending consult too sedated for consult
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
## 2019-03-21 NOTE — ED Notes (Addendum)
Pt still sleeping.  Chest rising and falling at this time.
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
## 2019-03-21 NOTE — ED Notes (Signed)
Pt provided meal tray by this tech, pt is currently asleep at this time  

## 2019-03-22 ENCOUNTER — Encounter: Payer: Self-pay | Admitting: Psychiatry

## 2019-03-22 DIAGNOSIS — Z91419 Personal history of unspecified adult abuse: Secondary | ICD-10-CM

## 2019-03-22 DIAGNOSIS — R451 Restlessness and agitation: Secondary | ICD-10-CM

## 2019-03-22 DIAGNOSIS — F39 Unspecified mood [affective] disorder: Secondary | ICD-10-CM

## 2019-03-22 DIAGNOSIS — F1220 Cannabis dependence, uncomplicated: Secondary | ICD-10-CM

## 2019-03-22 DIAGNOSIS — F4310 Post-traumatic stress disorder, unspecified: Secondary | ICD-10-CM

## 2019-03-22 DIAGNOSIS — Z91410 Personal history of adult physical and sexual abuse: Secondary | ICD-10-CM

## 2019-03-22 LAB — URINE DRUG SCREEN, QUALITATIVE (ARMC ONLY)
Amphetamines, Ur Screen: NOT DETECTED
Barbiturates, Ur Screen: NOT DETECTED
Benzodiazepine, Ur Scrn: POSITIVE — AB
Cannabinoid 50 Ng, Ur ~~LOC~~: POSITIVE — AB
Cocaine Metabolite,Ur ~~LOC~~: NOT DETECTED
MDMA (Ecstasy)Ur Screen: NOT DETECTED
Methadone Scn, Ur: NOT DETECTED
Opiate, Ur Screen: NOT DETECTED
Phencyclidine (PCP) Ur S: NOT DETECTED
Tricyclic, Ur Screen: NOT DETECTED

## 2019-03-22 MED ORDER — ZIPRASIDONE MESYLATE 20 MG IM SOLR
20.0000 mg | Freq: Once | INTRAMUSCULAR | Status: AC
  Administered 2019-03-22: 20 mg via INTRAMUSCULAR

## 2019-03-22 MED ORDER — LORAZEPAM 2 MG/ML IJ SOLN
INTRAMUSCULAR | Status: AC
  Administered 2019-03-22: 11:00:00 2 mg via INTRAMUSCULAR
  Filled 2019-03-22: qty 1

## 2019-03-22 MED ORDER — HYDROXYZINE HCL 25 MG PO TABS
50.0000 mg | ORAL_TABLET | Freq: Once | ORAL | Status: AC
  Administered 2019-03-22: 50 mg via ORAL
  Filled 2019-03-22: qty 2

## 2019-03-22 MED ORDER — ZIPRASIDONE HCL 20 MG PO CAPS
20.0000 mg | ORAL_CAPSULE | Freq: Two times a day (BID) | ORAL | Status: DC
  Administered 2019-03-22: 18:00:00 20 mg via ORAL
  Filled 2019-03-22 (×4): qty 1

## 2019-03-22 NOTE — ED Notes (Signed)
Patient talking to her mother on the phone and becoming anxious and excited, because she is ready to go home. Patient feels that she is the only one that can cure herself, because she is the only one that knows herself. Patient appears to have a good relationship with her mother. Patient is able to talk with this Clinical research associate and tell this Clinical research associate she is becoming anxious and she asked the nurse and MD for some medication to help her calm down.  MD notified and Vistaril 50mg  ordered

## 2019-03-22 NOTE — ED Notes (Signed)
Patient appropriate and cooperative, patient took medication with no complaints

## 2019-03-22 NOTE — ED Notes (Signed)
Patient talking with mother on phone

## 2019-03-22 NOTE — ED Notes (Signed)
Patient was EKG appropriate and coo
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
## 2019-03-22 NOTE — ED Notes (Signed)
BEHAVIORAL HEALTH ROUNDING Patient sleeping: No. Patient alert and oriented: yes Behavior appropriate: Yes.  ; If no, describe:  Nutrition and fluids offered: yes Toileting and hygiene offered: Yes  Sitter present: q15 minute observations and security monitoring Law enforcement present: Yes    

## 2019-03-22 NOTE — ED Notes (Signed)
Patient talking to psychiatrist Dr. Carmon Ginsberg

## 2019-03-22 NOTE — ED Notes (Signed)
Patient is on the phone and telling the person and saying that she needs to be high, she cant function without her weed (marijuanna) she said to my "they (staff) just want to keep me here, they not trying to let me leave" patient then began talking about boyfriend and that she cant leave him alone, she loves him and they need each other, but she wants to leave him alone. Patient is becoming loud, and frustrated.

## 2019-03-22 NOTE — ED Notes (Signed)
Patient taking a shower.
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
## 2019-03-23 MED ORDER — ZIPRASIDONE HCL 20 MG PO CAPS: 20.0000 mg | ORAL_CAPSULE | Freq: Two times a day (BID) | ORAL | 2 refills | Status: DC

## 2019-03-23 MED ORDER — LORAZEPAM 1 MG PO TABS
1.0000 mg | ORAL_TABLET | Freq: Once | ORAL | Status: AC
  Administered 2019-03-23: 1 mg via ORAL
  Filled 2019-03-23: qty 1

## 2019-03-23 NOTE — ED Notes (Signed)
Patient awake.  Asked CNA for a drink.  Patient calm and cooperative.

## 2019-03-23 NOTE — ED Notes (Signed)
Patient discharged home with mother, patient received discharge papers. Patient received belongings and verbalized she has received all of her belongings. Patient appropriate and cooperative, Denies SI/HI AVH. Vital signs taken. NAD noted. 

## 2019-03-23 NOTE — ED Notes (Signed)
BEHAVIORAL HEALTH ROUNDING Patient sleeping: No. Patient alert and oriented: yes Behavior appropriate: Yes.  ; If no, describe:  Nutrition and fluids offered: yes Toileting and hygiene offered: Yes  Sitter present: q15 minute observations and security monitoring Law enforcement present: Yes    

## 2019-03-23 NOTE — ED Notes (Signed)
Patient came out of room and said I will take the Geodon, when am I going home, writer explained to her the process and that the psychiatrist will be here as soon as possible. Patient said ok, and apologized for her behavior yesterday and said that she likes everyone here she just has problems. Writer explained today is a new day, lets make it a better one and patient agreed.
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
## 2019-03-23 NOTE — ED Notes (Signed)
Pt taking shower. Given clean scrubs.

## 2019-03-23 NOTE — ED Provider Notes (Signed)
Patient has been cleared by psychiatry for discharge   Emily Filbert, MD 03/23/19 [phone]
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
## 2019-03-23 NOTE — ED Provider Notes (Signed)
-----------------------------------------   12:41 AM on 03/23/2019 -----------------------------------------   Blood pressure (!) 110/55, pulse 88, temperature 98.4 F (36.9 C), temperature source Oral, resp. rate 16, height 5\' 5"  (1.651 m), weight 72.6 kg, SpO2 97 %, unknown if currently breastfeeding.  The patient is calm and cooperative at this time.  There have been no acute events since the last update.  Awaiting disposition plan from Behavioral Medicine team.    Arnaldo Natal, MD 03/23/19 [phone]

## 2019-03-23 NOTE — ED Notes (Signed)
Patient pacing in room. Patent asked staff to give her something to help her sleep and for anxiety, it is 0800 and patient said she has been up since 0600. Writer asked patient does she know why she is feeling so anxious, because she may be going home, isnt that what you wanted and patient said because I am going home. Patient given Ativan 1mg  PO for anxiety, patient refused to take the Geodon until she sees the psychiatrist she said she will take it then, she said " im not trying to be defiant or rude, I will take that medicine when psychiatrist comes and sends me home". Patient affect appears flat, and she does appear anxious.

## 2020-05-31 ENCOUNTER — Other Ambulatory Visit: Payer: Self-pay

## 2020-05-31 ENCOUNTER — Ambulatory Visit
Admission: RE | Admit: 2020-05-31 | Discharge: 2020-05-31 | Disposition: A | Discharge status: Home / Self Care | Payer: Medicaid Other | Source: Ambulatory Visit | Attending: Physician Assistant | Admitting: Physician Assistant

## 2020-05-31 ENCOUNTER — Ambulatory Visit
Admission: RE | Admit: 2020-05-31 | Discharge: 2020-05-31 | Disposition: A | Discharge status: Home / Self Care | Payer: Medicaid Other | Attending: Physician Assistant | Admitting: Physician Assistant

## 2020-05-31 ENCOUNTER — Other Ambulatory Visit: Payer: Self-pay | Admitting: Physician Assistant

## 2020-05-31 DIAGNOSIS — S99922A Unspecified injury of left foot, initial encounter: Secondary | ICD-10-CM

## 2020-05-31 DIAGNOSIS — S99929A Unspecified injury of unspecified foot, initial encounter: Secondary | ICD-10-CM | POA: Insufficient documentation

## 2021-05-18 NOTE — ED Triage Notes (Signed)
 Pt has hx of schizophrenia and has not been taking meds for over 1 year. Pt called [facility] several times worried that someone was going to kill her. Pt reports she has not been sleeping at night.

## 2022-04-20 ENCOUNTER — Encounter: Payer: Medicaid Other | Admitting: Advanced Practice Midwife

## 2022-05-22 ENCOUNTER — Other Ambulatory Visit: Payer: Self-pay

## 2022-05-22 ENCOUNTER — Ambulatory Visit (HOSPITAL_COMMUNITY)
Admission: EM | Admit: 2022-05-22 | Discharge: 2022-05-24 | Disposition: A | Discharge status: Other Acute Inpatient Hospital | Payer: Medicaid Other | Attending: Student | Admitting: Student

## 2022-05-22 ENCOUNTER — Emergency Department
Admission: EM | Admit: 2022-05-22 | Discharge: 2022-05-22 | Discharge status: Against Medical Advice | Payer: Medicaid Other | Attending: Emergency Medicine | Admitting: Emergency Medicine

## 2022-05-22 DIAGNOSIS — F200 Paranoid schizophrenia: Secondary | ICD-10-CM | POA: Diagnosis not present

## 2022-05-22 DIAGNOSIS — Z046 Encounter for general psychiatric examination, requested by authority: Secondary | ICD-10-CM | POA: Diagnosis not present

## 2022-05-22 DIAGNOSIS — F1210 Cannabis abuse, uncomplicated: Secondary | ICD-10-CM | POA: Insufficient documentation

## 2022-05-22 DIAGNOSIS — Z20822 Contact with and (suspected) exposure to covid-19: Secondary | ICD-10-CM | POA: Insufficient documentation

## 2022-05-22 DIAGNOSIS — Z5321 Procedure and treatment not carried out due to patient leaving prior to being seen by health care provider: Secondary | ICD-10-CM | POA: Insufficient documentation

## 2022-05-22 DIAGNOSIS — F17210 Nicotine dependence, cigarettes, uncomplicated: Secondary | ICD-10-CM | POA: Insufficient documentation

## 2022-05-22 DIAGNOSIS — F4310 Post-traumatic stress disorder, unspecified: Secondary | ICD-10-CM | POA: Insufficient documentation

## 2022-05-22 DIAGNOSIS — F319 Bipolar disorder, unspecified: Secondary | ICD-10-CM | POA: Insufficient documentation

## 2022-05-22 DIAGNOSIS — R451 Restlessness and agitation: Secondary | ICD-10-CM | POA: Insufficient documentation

## 2022-05-22 DIAGNOSIS — F3130 Bipolar disorder, current episode depressed, mild or moderate severity, unspecified: Secondary | ICD-10-CM | POA: Diagnosis present

## 2022-05-22 HISTORY — DX: Schizophrenia, unspecified: F20.9

## 2022-05-22 LAB — CBC
HCT: 42 % (ref 36.0–46.0)
Hemoglobin: 14.3 g/dL (ref 12.0–15.0)
MCH: 30.2 pg (ref 26.0–34.0)
MCHC: 34 g/dL (ref 30.0–36.0)
MCV: 88.8 fL (ref 80.0–100.0)
Platelets: 267 10*3/uL (ref 150–400)
RBC: 4.73 MIL/uL (ref 3.87–5.11)
RDW: 11.6 % (ref 11.5–15.5)
WBC: 12.5 10*3/uL — ABNORMAL HIGH (ref 4.0–10.5)
nRBC: 0 % (ref 0.0–0.2)

## 2022-05-22 LAB — COMPREHENSIVE METABOLIC PANEL
ALT: 16 U/L (ref 0–44)
AST: 23 U/L (ref 15–41)
Albumin: 4.6 g/dL (ref 3.5–5.0)
Alkaline Phosphatase: 54 U/L (ref 38–126)
Anion gap: 7 (ref 5–15)
BUN: 10 mg/dL (ref 6–20)
CO2: 22 mmol/L (ref 22–32)
Calcium: 9.2 mg/dL (ref 8.9–10.3)
Chloride: 109 mmol/L (ref 98–111)
Creatinine, Ser: 0.73 mg/dL (ref 0.44–1.00)
GFR, Estimated: >60 mL/min (ref >60)
Glucose, Bld: 123 mg/dL — ABNORMAL HIGH (ref 70–99)
Potassium: 3.5 mmol/L (ref 3.5–5.1)
Sodium: 138 mmol/L (ref 135–145)
Total Bilirubin: 0.4 mg/dL (ref 0.3–1.2)
Total Protein: 8.7 g/dL — ABNORMAL HIGH (ref 6.5–8.1)

## 2022-05-22 LAB — URINE DRUG SCREEN, QUALITATIVE (ARMC ONLY)
Amphetamines, Ur Screen: NOT DETECTED
Barbiturates, Ur Screen: NOT DETECTED
Benzodiazepine, Ur Scrn: NOT DETECTED
Cannabinoid 50 Ng, Ur ~~LOC~~: NOT DETECTED
Cocaine Metabolite,Ur ~~LOC~~: NOT DETECTED
MDMA (Ecstasy)Ur Screen: NOT DETECTED
Methadone Scn, Ur: NOT DETECTED
Opiate, Ur Screen: NOT DETECTED
Phencyclidine (PCP) Ur S: NOT DETECTED
Tricyclic, Ur Screen: NOT DETECTED

## 2022-05-22 LAB — POC URINE PREG, ED: Preg Test, Ur: NEGATIVE

## 2022-05-22 LAB — SALICYLATE LEVEL: Salicylate Lvl: <7 mg/dL — ABNORMAL LOW (ref 7.0–30.0)

## 2022-05-22 LAB — ACETAMINOPHEN LEVEL: Acetaminophen (Tylenol), Serum: <10 ug/mL — ABNORMAL LOW (ref 10–30)

## 2022-05-22 LAB — ETHANOL: Alcohol, Ethyl (B): <10 mg/dL (ref <10)

## 2022-05-22 NOTE — ED Notes (Signed)
Pt dressed out into hospital attire. Pt belongings to include: 1 green shirt 1 blue pants 2 [employer]ko shoes 1 Bartholomew bra

## 2022-05-22 NOTE — ED Triage Notes (Signed)
Pt comes with c/o mental health evaluation. Pt recently started on medication for her bipolar 2-3 weeks ago. Mom reports pt has not been the same. Mom states it was a shot and they were instructed administer at home. Mom states they only did it the one time. Pt feels paranoid. Pt hasn't slept in at least 2 days. Pt not really eating.   Mom states pt has not been on her meds for months.  Pt denies any SI or HI. Pt denies any hallucinations. Pt is calm and appears solmonent.

## 2022-05-22 NOTE — Progress Notes (Signed)
Pt has been off her depakote for months.  She takes a monthly Invega shot.  The last dose was too much.  She has not been consistently taking the invega either.  The dose on 04/18/22 was the first "in awhile."  Pt denies SI, HI or A/V hallucinations.  Pt is tense and anxious.  Pt is routine.

## 2022-05-23 ENCOUNTER — Encounter (HOSPITAL_COMMUNITY): Payer: Self-pay | Admitting: Nurse Practitioner

## 2022-05-23 DIAGNOSIS — F3130 Bipolar disorder, current episode depressed, mild or moderate severity, unspecified: Secondary | ICD-10-CM | POA: Diagnosis present

## 2022-05-23 LAB — LIPID PANEL
Cholesterol: 149 mg/dL (ref 0–200)
HDL: 60 mg/dL (ref >40)
LDL Cholesterol: 83 mg/dL (ref 0–99)
Total CHOL/HDL Ratio: 2.5 RATIO
Triglycerides: 31 mg/dL (ref <150)
VLDL: 6 mg/dL (ref 0–40)

## 2022-05-23 LAB — POCT URINE DRUG SCREEN - MANUAL ENTRY (I-SCREEN)
POC Amphetamine UR: NOT DETECTED
POC Buprenorphine (BUP): NOT DETECTED
POC Cocaine UR: NOT DETECTED
POC Marijuana UR: NOT DETECTED
POC Methadone UR: NOT DETECTED
POC Methamphetamine UR: NOT DETECTED
POC Morphine: NOT DETECTED
POC Oxazepam (BZO): POSITIVE — AB
POC Oxycodone UR: NOT DETECTED
POC Secobarbital (BAR): NOT DETECTED

## 2022-05-23 LAB — COMPREHENSIVE METABOLIC PANEL
ALT: 16 U/L (ref 0–44)
AST: 22 U/L (ref 15–41)
Albumin: 4.4 g/dL (ref 3.5–5.0)
Alkaline Phosphatase: 49 U/L (ref 38–126)
Anion gap: 12 (ref 5–15)
BUN: 8 mg/dL (ref 6–20)
CO2: 23 mmol/L (ref 22–32)
Calcium: 9.4 mg/dL (ref 8.9–10.3)
Chloride: 104 mmol/L (ref 98–111)
Creatinine, Ser: 0.82 mg/dL (ref 0.44–1.00)
GFR, Estimated: >60 mL/min (ref >60)
Glucose, Bld: 92 mg/dL (ref 70–99)
Potassium: 3.9 mmol/L (ref 3.5–5.1)
Sodium: 139 mmol/L (ref 135–145)
Total Bilirubin: 0.6 mg/dL (ref 0.3–1.2)
Total Protein: 7.9 g/dL (ref 6.5–8.1)

## 2022-05-23 LAB — CBC WITH DIFFERENTIAL/PLATELET
Abs Immature Granulocytes: 0.07 10*3/uL (ref 0.00–0.07)
Basophils Absolute: 0.1 10*3/uL (ref 0.0–0.1)
Basophils Relative: 0 %
Eosinophils Absolute: 0.1 10*3/uL (ref 0.0–0.5)
Eosinophils Relative: 1 %
HCT: 40.3 % (ref 36.0–46.0)
Hemoglobin: 14.1 g/dL (ref 12.0–15.0)
Immature Granulocytes: 1 %
Lymphocytes Relative: 16 %
Lymphs Abs: 2.3 10*3/uL (ref 0.7–4.0)
MCH: 31.1 pg (ref 26.0–34.0)
MCHC: 35 g/dL (ref 30.0–36.0)
MCV: 89 fL (ref 80.0–100.0)
Monocytes Absolute: 1 10*3/uL (ref 0.1–1.0)
Monocytes Relative: 7 %
Neutro Abs: 10.9 10*3/uL — ABNORMAL HIGH (ref 1.7–7.7)
Neutrophils Relative %: 75 %
Platelets: 309 10*3/uL (ref 150–400)
RBC: 4.53 MIL/uL (ref 3.87–5.11)
RDW: 11.8 % (ref 11.5–15.5)
WBC: 14.4 10*3/uL — ABNORMAL HIGH (ref 4.0–10.5)
nRBC: 0 % (ref 0.0–0.2)

## 2022-05-23 LAB — RESP PANEL BY RT-PCR (FLU A&B, COVID) ARPGX2
Influenza A by PCR: NEGATIVE
Influenza B by PCR: NEGATIVE
SARS Coronavirus 2 by RT PCR: NEGATIVE

## 2022-05-23 LAB — POC SARS CORONAVIRUS 2 AG -  ED: SARS Coronavirus 2 Ag: NEGATIVE

## 2022-05-23 LAB — ETHANOL: Alcohol, Ethyl (B): <10 mg/dL (ref <10)

## 2022-05-23 LAB — TSH: TSH: 1.379 u[IU]/mL (ref 0.350–4.500)

## 2022-05-23 LAB — POC SARS CORONAVIRUS 2 AG: SARSCOV2ONAVIRUS 2 AG: NEGATIVE

## 2022-05-23 LAB — POCT PREGNANCY, URINE: Preg Test, Ur: NEGATIVE

## 2022-05-23 LAB — HEMOGLOBIN A1C
Hgb A1c MFr Bld: 4.7 % — ABNORMAL LOW (ref 4.8–5.6)
Mean Plasma Glucose: 88.19 mg/dL

## 2022-05-23 MED ORDER — NICOTINE 7 MG/24HR TD PT24: 7.0000 mg | MEDICATED_PATCH | Freq: Every day | TRANSDERMAL | Status: DC | PRN

## 2022-05-23 MED ORDER — ALUM & MAG HYDROXIDE-SIMETH 200-200-20 MG/5ML PO SUSP: 30.0000 mL | ORAL | Status: DC | PRN

## 2022-05-23 MED ORDER — LORAZEPAM 1 MG PO TABS
1.0000 mg | ORAL_TABLET | Freq: Once | ORAL | Status: AC
  Administered 2022-05-23: 1 mg via ORAL
  Filled 2022-05-23: qty 1

## 2022-05-23 MED ORDER — HYDROXYZINE HCL 25 MG PO TABS: 25.0000 mg | ORAL_TABLET | Freq: Three times a day (TID) | ORAL | Status: DC | PRN

## 2022-05-23 MED ORDER — TRAZODONE HCL 50 MG PO TABS
50.0000 mg | ORAL_TABLET | Freq: Every evening | ORAL | Status: DC | PRN
  Administered 2022-05-24: 50 mg via ORAL
  Filled 2022-05-23: qty 1

## 2022-05-23 MED ORDER — LORAZEPAM 1 MG PO TABS: 1.0000 mg | ORAL_TABLET | Freq: Three times a day (TID) | ORAL | Status: DC | PRN

## 2022-05-23 MED ORDER — ACETAMINOPHEN 325 MG PO TABS: 650.0000 mg | ORAL_TABLET | Freq: Four times a day (QID) | ORAL | Status: DC | PRN

## 2022-05-23 MED ORDER — PALIPERIDONE ER 6 MG PO TB24
6.0000 mg | ORAL_TABLET | Freq: Every day | ORAL | Status: DC
  Administered 2022-05-23 – 2022-05-24 (×2): 6 mg via ORAL
  Filled 2022-05-23 (×2): qty 1

## 2022-05-23 MED ORDER — NICOTINE POLACRILEX 2 MG MT GUM
2.0000 mg | CHEWING_GUM | OROMUCOSAL | Status: DC | PRN
Start: 2022-05-23 — End: 2022-05-24
  Administered 2022-05-23 (×2): 2 mg via ORAL
  Filled 2022-05-23 (×2): qty 1

## 2022-05-23 MED ORDER — MAGNESIUM HYDROXIDE 400 MG/5ML PO SUSP: 30.0000 mL | Freq: Every day | ORAL | Status: DC | PRN

## 2022-05-23 MED ORDER — NICOTINE POLACRILEX 2 MG MT GUM
CHEWING_GUM | OROMUCOSAL | Status: AC
  Filled 2022-05-23: qty 1

## 2022-05-23 MED ORDER — ZIPRASIDONE MESYLATE 20 MG IM SOLR: 20.0000 mg | Freq: Two times a day (BID) | INTRAMUSCULAR | Status: DC | PRN

## 2022-05-23 NOTE — ED Provider Notes (Addendum)
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 completed and medically stable for IP. No beds at [facility] and OV thus far.  [facility]24/2022 after 0900. Accepting MD is Dr. Forrestine Him. Reporting number is [phone]. Petition and First Exam has been faxed.  Plan [facility] 05/24/2022 Continue admission to obs Collateral with mom Agitation PRNs Considering invega PO for mania if patient has not received this months LAI  Invega NRTs  Attestation: Unfortunately, there are no beds at [facility] for this patient. Patient still meets requirements for inpatient psych and is medically stable.  Pending IP psych bed availability.  Princess Bruins, DO   Princess Bruins, DO 05/23/2022 10:55 AM

## 2022-05-23 NOTE — Progress Notes (Signed)
BHH/BMU LCSW Progress Note   05/23/2022    2:19 PM  Eileen Moody   [id number]   Type of Contact and Topic:  Psychiatric Bed Placement   Pt [facility]    Patient meets inpatient criteria per Dr. Bronwen Betters   The attending provider will be Dr. Forrestine Him   Call report to [phone]  Bedelia Person, RN @ [facility] notified.     Pt scheduled  to arrive at [facility] AFTER 0900. Please fax IVC paperwork prior to transporting this Pt.    Damita Dunnings, MSW, LCSW-A  2:21 PM 05/23/2022

## 2022-05-23 NOTE — ED Notes (Signed)
Pt was taken outside for some fresh air.  Pt asked several times to be allowed to leave " to see my kids".  Pt was informed that unfortunately that was not an option.  Pt was redirectable and came back inside to her room without incident.

## 2022-05-23 NOTE — ED Notes (Signed)
I spoke with admissions at [facility] per Dr. Cyndie Chime,   [facility] admissions stated that due to her schizoeffective dx they do not have a bed at this time on  one of the units that are appropriate for her.    She said that after 3p they might have a bed in [facility] open [place] and our SW should check back at that time.
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
## 2022-05-23 NOTE — Progress Notes (Signed)
Patient has been denied by [facility] due to no appropriate beds available. Patient meets [facility] inpatient criteria per Dr. Bronwen Betters. Patient has been faxed out to the following facilities:   [facility]  [address] [place] [address] [phone]  [facility]  [address], [place] [address] [phone]  [facility]  [address] [place] [address] [phone]  [facility]  [address], [place] [address] [phone]  [facility]  [address] [place] [address] [phone]  [facility]  [address] [place] [address] [phone]  [facility]  [address] [place], [place] [address] [phone]  [facility]  [address] [place], [place] [address] [phone]  [facility]  [address] [place] [address] [phone]  [facility]  [address], [place] [address] [phone]  [facility]  [address], [place] [address] [phone]  [facility]  [address], [place] [address] [phone]   Damita Dunnings, MSW, LCSW-A  9:46 AM 05/23/2022

## 2022-05-23 NOTE — ED Notes (Signed)
Pt is awake and alert.  She was given cereal for breakfast.  Pt was also given hygiene supplies. Pt has blunted affect and flat depressed mood.  She denies SI, HI or AVH when asked.

## 2022-05-23 NOTE — ED Notes (Signed)
Pt stated she would take invega,  Also was given nicotine gum and dinner tray.

## 2022-05-23 NOTE — Progress Notes (Deleted)
BHH/BMU LCSW Progress Note   05/23/2022    11:52 AM  Eileen Moody   [id number]   Type of Contact and Topic:  Psychiatric Bed Placement   [facility]      Patient meets inpatient criteria per Dr. Bronwen Betters   The attending provider will be Dr. Forrestine Him   Call report to [phone]  Bedelia Person, RN @ [facility]    Pt scheduled  to arrive at [facility] AFTER 0900. Please send IVC paperwork prior to transporting this Pt.    Damita Dunnings, MSW, LCSW-A  11:53 AM 05/23/2022

## 2022-05-23 NOTE — ED Provider Notes (Signed)
[facility]gent Care Continuous Assessment Admission H&P  Date: 05/23/22 Patient Name: Eileen Moody MRN: [id number] Chief Complaint: No chief complaint on file.     Diagnoses:  Final diagnoses:  Bipolar affective disorder, current episode depressed, current episode severity unspecified (HCC)    HPI: Eileen Moody is a 26 y.o. female with a reported history of schizophrenia and bipolar disorder who presents to [facility] voluntarily with her mother Hoyle Sauer [phone]) and step-father who participate in assessment with the patient's permission. Patient was later petitioned for IVC by her mother. Per IVC, "The respondent has not been taking her medication and not eating. The respondent has not slept in days, she sits and stares into space, does not communicate with others, just stares at them. The respondent walks in and outside the home at all times of the hour and will not sleep thinking that someone is going to hurt her family. The respondent has acted in a way that created a substantial risk of serious bodily harm and there is a reasonable probability that this conduct will be repeated."   Patient's responses are delayed for several seconds. Patient stares at provider and eventually provides a brief response. Patient states that she has been visiting her mother the past two days. States that she lives in [place]. States that she receives psychiatric care at [facility] in [place], but missed her 05/08/22 appointment. Patient's mother reports that the patient has been receiving Gean Birchwood, but has not received consistently. States that she received an injection on 04/18/22, which she thinks was "240 mg." She states that prior to that injection she was receiving a lower dose. She states that the 04/18/22 injection was the first injection that they picked up from the pharmacy and administered at home. She reports that since the patient received the injection on 04/18/22 that the patient has been  sluggish. Patient's mother reports that the patient has also been prescribed Depakote in the past.   Patient reports that she has not slept in several days. She denies changes in appetite.  Patient's mother reports that the patient has not been sleeping. She states that the patient is constantly moving through the house. She states that he patient appears to be afraid and wants to be close to her at all times. She states that the children are afraid of her because she stares at them.   Patient reports that she is currently unemployed and previously worked as a Lawyer. She states that she has 3 children ages 30, 65, and 86. Patient's mother reports that the children are currently at her home.   On chart review, it is noted that the patient has history of aggressive behavior. She was last inpatient at [facility] in June 2022 due to mania and was treated for Bipolar Disorder. During that admission she was trialed on Risperdal and then transitioned to [place].   On evaluation patient is alert and oriented x 4. Speech is delayed and decreased in volume. Mood is depressed/anxious and affect is flat. Thought process is coherent. Patient is guarded/withdrawn and provides brief responses. Denies auditory and visual hallucinations. No delusions elicited during this assessment. Patient denies paranoia. However, patient appears to be anxious and paranoid. Denies suicidal ideations. Denies homicidal ideations. Denies substance abuse.    PHQ 2-9:   Flowsheet Row ED from 05/22/2022 in [facility] EMERGENCY DEPARTMENT ED from 03/21/2019 in [facility] EMERGENCY DEPARTMENT ED from 02/22/2019 in [facility] EMERGENCY DEPARTMENT  C-SSRS RISK CATEGORY No Risk No Risk No Risk        Total Time spent with patient: 30 minutes  Musculoskeletal  Strength & Muscle Tone: within normal limits Gait & Station: normal Patient leans:  N/A  Psychiatric Specialty Exam  Presentation General Appearance: Appropriate for Environment; Well Groomed  Eye Contact:Fair  Speech:Slow  Speech Volume:Decreased  Handedness:No data recorded  Mood and Affect  Mood:Depressed; Anxious  Affect:Flat   Thought Process  Thought Processes:Coherent  Descriptions of Associations:Intact  Orientation:Full (Time, Place and Person)  Thought Content:Logical  Diagnosis of Schizophrenia or Schizoaffective disorder in past: Yes (Schizoaffective d/o bipolar type)   Hallucinations:Hallucinations: None  Ideas of Reference:None  Suicidal Thoughts:Suicidal Thoughts: No  Homicidal Thoughts:Homicidal Thoughts: No   Sensorium  Memory:Immediate Fair; Recent Fair; Remote Poor  Judgment:Impaired  Insight:Lacking   Executive Functions  Concentration:Poor  Attention Span:Poor  Recall:Fair  Fund of Knowledge:Fair  Language:Fair   Psychomotor Activity  Psychomotor Activity:Psychomotor Activity: Decreased   Assets  Assets:Desire for Improvement; Financial Resources/Insurance; Physical Health; Social Support   Sleep  Sleep:Sleep: Poor   Nutritional Assessment (For OBS and FBC admissions only) Has the patient had a weight loss or gain of 10 pounds or more in the last 3 months?: No Has the patient had a decrease in food intake/or appetite?: No Does the patient have dental problems?: No Does the patient have eating habits or behaviors that may be indicators of an eating disorder including binging or inducing vomiting?: No Has the patient recently lost weight without trying?: 0 Has the patient been eating poorly because of a decreased appetite?: 0 Malnutrition Screening Tool Score: 0    Physical Exam Constitutional:      General: She is not in acute distress.    Appearance: She is not ill-appearing, toxic-appearing or diaphoretic.  HENT:     Head: Normocephalic.     Right Ear: External ear normal.     Left Ear:  External ear normal.  Eyes:     Conjunctiva/sclera: Conjunctivae normal.     Pupils: Pupils are equal, round, and reactive to light.  Cardiovascular:     Rate and Rhythm: Normal rate.  Pulmonary:     Effort: Pulmonary effort is normal. No respiratory distress.  Musculoskeletal:        General: Normal range of motion.  Skin:    General: Skin is warm and dry.  Neurological:     Mental Status: She is alert and oriented to person, place, and time.  Psychiatric:        Mood and Affect: Mood is anxious and depressed.        Speech: Speech is delayed.        Thought Content: Thought content does not include homicidal or suicidal ideation.   Review of Systems  Constitutional:  Negative for chills, diaphoresis, fever, malaise/fatigue and weight loss.  HENT:  Negative for congestion.   Respiratory:  Negative for cough and shortness of breath.   Cardiovascular:  Negative for chest pain and palpitations.  Gastrointestinal:  Negative for diarrhea, nausea and vomiting.  Neurological:  Negative for dizziness and seizures.  Psychiatric/Behavioral:  Positive for depression. Negative for hallucinations, memory loss, substance abuse and suicidal ideas. The patient is nervous/anxious and has insomnia.   All other systems reviewed and are negative.  Blood pressure (!) 142/97, pulse (!) 116, temperature 98.1 F (36.7 C), temperature source Oral, resp. rate 18, SpO2 98 %, unknown if currently breastfeeding. There is no height or weight on  file to calculate BMI.  Past Psychiatric History: On chart review, it is noted that the patient has history of aggressive behavior. She was last inpatient at [facility] in June 2022 due to mania and was treated for Bipolar Disorder. During that admission she was trialed on Risperdal and then transitioned to [place].   Is the patient at risk to self? No  Has the patient been a risk to self in the past 6 months? No .    Has the patient  been a risk to self within the distant past? Yes   Is the patient a risk to others? No   Has the patient been a risk to others in the past 6 months? No   Has the patient been a risk to others within the distant past? Yes   Past Medical History:  Past Medical History:  Diagnosis Date   Anemia    History of anemia    during pregnancy   Schizophrenia (HCC)    STD (sexually transmitted disease) 2015   H/o gonorrhea/chlamydia.  Treated    Past Surgical History:  Procedure Laterality Date   NO PAST SURGERIES      Family History:  Family History  Problem Relation Age of Onset   Diabetes Neg Hx    Hypertension Neg Hx    Cancer Neg Hx     Social History:  Social History   Socioeconomic History   Marital status: Single    Spouse name: [name]   Number of children: 2   Years of education: Not on file   Highest education level: Not on file  Occupational History   Not on file  Tobacco Use   Smoking status: Every Day    Packs/day: 1.00    Types: Cigarettes   Smokeless tobacco: Never  Substance and Sexual Activity   Alcohol use: No   Drug use: No   Sexual activity: Yes    Birth control/protection: None  Other Topics Concern   Not on file  Social History Narrative   Not on file   Social Determinants of Health   Financial Resource Strain: Not on file  Food Insecurity: Not on file  Transportation Needs: Not on file  Physical Activity: Not on file  Stress: Not on file  Social Connections: Not on file  Intimate Partner Violence: Not on file    SDOH:  SDOH Screenings   Alcohol Screen: Not on file  Depression (PHQ2-9): Not on file  Financial Resource Strain: Not on file  Food Insecurity: Not on file  Housing: Not on file  Physical Activity: Not on file  Social Connections: Not on file  Stress: Not on file  Tobacco Use: High Risk   Smoking Tobacco Use: Every Day   Smokeless Tobacco Use: Never   Passive Exposure: Not on file  Transportation Needs: Not on file     Last Labs:  Admission on 05/22/2022, Discharged on 05/22/2022  Component Date Value Ref Range Status   Sodium 05/22/2022 138  135 - 145 mmol/L Final   Potassium 05/22/2022 3.5  3.5 - 5.1 mmol/L Final   Chloride 05/22/2022 109  98 - 111 mmol/L Final   CO2 05/22/2022 22  22 - 32 mmol/L Final   Glucose, Bld 05/22/2022 123 (H)  70 - 99 mg/dL Final   Glucose reference range applies only to samples taken after fasting for at least 8 hours.   BUN 05/22/2022 10  6 - 20 mg/dL Final   Creatinine,  Ser 05/22/2022 0.73  0.44 - 1.00 mg/dL Final   Calcium 24/58/0998 9.2  8.9 - 10.3 mg/dL Final   Total Protein 33/82/5053 8.7 (H)  6.5 - 8.1 g/dL Final   Albumin 97/67/3419 4.6  3.5 - 5.0 g/dL Final   AST 37/90/2409 23  15 - 41 U/L Final   ALT 05/22/2022 16  0 - 44 U/L Final   Alkaline Phosphatase 05/22/2022 54  38 - 126 U/L Final   Total Bilirubin 05/22/2022 0.4  0.3 - 1.2 mg/dL Final   GFR, Estimated 05/22/2022 >60  >60 mL/min Final   Comment: (NOTE) Calculated using the CKD-EPI Creatinine Equation (2021)    Anion gap 05/22/2022 7  5 - 15 Final   Performed at [facility], [address] [place], [place] [address]   Alcohol, Ethyl (B) 05/22/2022 <10  <10 mg/dL Final   Comment: (NOTE) Lowest detectable limit for serum alcohol is 10 mg/dL.  For medical purposes only. Performed at [facility], [address] [place], [place] [address]    Salicylate Lvl 05/22/2022 <7.0 (L)  7.0 - 30.0 mg/dL Final   Performed at [facility], [address] [place], [place] [address]   Acetaminophen (Tylenol), Serum 05/22/2022 <10 (L)  10 - 30 ug/mL Final   Comment: (NOTE) Therapeutic concentrations vary significantly. A range of 10-30 ug/mL  may be an effective concentration for many patients. However, some  are best treated at concentrations outside of this range. Acetaminophen concentrations >150 ug/mL at 4 hours after ingestion  and >50 ug/mL at 12 hours after ingestion  are often associated with  toxic reactions.  Performed at [facility], [address] [place], [place] [address]    WBC 05/22/2022 12.5 (H)  4.0 - 10.5 K/uL Final   RBC 05/22/2022 4.73  3.87 - 5.11 MIL/uL Final   Hemoglobin 05/22/2022 14.3  12.0 - 15.0 g/dL Final   HCT 97/98/9211 42.0  36.0 - 46.0 % Final   MCV 05/22/2022 88.8  80.0 - 100.0 fL Final   MCH 05/22/2022 30.2  26.0 - 34.0 pg Final   MCHC 05/22/2022 34.0  30.0 - 36.0 g/dL Final   RDW 94/17/4081 11.6  11.5 - 15.5 % Final   Platelets 05/22/2022 267  150 - 400 K/uL Final   nRBC 05/22/2022 0.0  0.0 - 0.2 % Final   Performed at [facility], [address] [place], [place] [address]   Tricyclic, Ur Screen 05/22/2022 NONE DETECTED  NONE DETECTED Final   Amphetamines, Ur Screen 05/22/2022 NONE DETECTED  NONE DETECTED Final   MDMA (Ecstasy)Ur Screen 05/22/2022 NONE DETECTED  NONE DETECTED Final   Cocaine Metabolite,Ur [place] 05/22/2022 NONE DETECTED  NONE DETECTED Final   Opiate, Ur Screen 05/22/2022 NONE DETECTED  NONE DETECTED Final   Phencyclidine (PCP) Ur S 05/22/2022 NONE DETECTED  NONE DETECTED Final   Cannabinoid 50 Ng, Ur [place] 05/22/2022 NONE DETECTED  NONE DETECTED Final   Barbiturates, Ur Screen 05/22/2022 NONE DETECTED  NONE DETECTED Final   Benzodiazepine, Ur Scrn 05/22/2022 NONE DETECTED  NONE DETECTED Final   Methadone Scn, Ur 05/22/2022 NONE DETECTED  NONE DETECTED Final   Comment: (NOTE) Tricyclics + metabolites, urine    Cutoff 1000 ng/mL Amphetamines + metabolites, urine  Cutoff 1000 ng/mL MDMA (Ecstasy), urine              Cutoff 500 ng/mL Cocaine Metabolite, urine          Cutoff 300 ng/mL Opiate +  metabolites, urine        Cutoff 300 ng/mL Phencyclidine (PCP), urine         Cutoff 25 ng/mL Cannabinoid, urine                 Cutoff 50 ng/mL Barbiturates + metabolites, urine  Cutoff 200 ng/mL Benzodiazepine, urine              Cutoff 200 ng/mL Methadone, urine                   Cutoff 300  ng/mL  The urine drug screen provides only a preliminary, unconfirmed analytical test result and should not be used for non-medical purposes. Clinical consideration and professional judgment should be applied to any positive drug screen result due to possible interfering substances. A more specific alternate chemical method must be used in order to obtain a confirmed analytical result. Gas chromatography / mass spectrometry (GC/MS) is the preferred confirm                          atory method. Performed at [facility], [address] [place], [place] [id number]    Preg Test, Ur 05/22/2022 NEGATIVE  NEGATIVE Final   Comment:        THE SENSITIVITY OF THIS METHODOLOGY IS >24 mIU/mL     Allergies: Patient has no known allergies.  PTA Medications:  Prior to Admission medications   Medication Sig Start Date End Date Taking? Authorizing Provider  acetaminophen (TYLENOL) 325 MG tablet Take 2 tablets (650 mg total) every 6 (six) hours as needed by mouth (for pain scale < 4). Patient not taking: Reported on 03/21/2019 10/22/17   Hildred Laserherry, Anika, MD  amoxicillin (AMOXIL) 875 MG tablet Take 875 mg by mouth 2 (two) times daily. 02/17/22   [provider]  docusate sodium (COLACE) 100 MG capsule Take 1 capsule (100 mg total) 2 (two) times daily as needed by mouth. Patient not taking: Reported on 11/13/2017 10/22/17   Hildred Laserherry, Anika, MD  ferrous sulfate (FERROUSUL) 325 (65 FE) MG tablet Take 1 tablet (325 mg total) 2 (two) times daily by mouth. Patient not taking: Reported on 03/21/2019 10/22/17   Hildred Laserherry, Anika, MD  ibuprofen (ADVIL,MOTRIN) 800 MG tablet Take 1 tablet (800 mg total) every 8 (eight) hours as needed by mouth. Patient not taking: Reported on 11/13/2017 10/22/17   Hildred Laserherry, Anika, MD  [facility] 156 MG/ML SUSY injection Inject 156 mg into the muscle every 30 (thirty) days. 04/19/22   [provider]  Prenatal Multivit-Min-Fe-FA (PRENATAL VITAMINS PO) Take by  mouth.    [provider]  ziprasidone (GEODON) 20 MG capsule Take 1 capsule (20 mg total) by mouth 2 (two) times daily with a meal. 03/23/19   Emily FilbertWilliams, Jonathan E, MD     Medical Decision Making  Admit to continuous assessment for crisis stabilization  Patient declined admission to continuous assessment. Patient denies SI, HI, AVH and patient's mother denies that the patient has made any homicidal or suicidal statements. This provider does not feel that the patient meets IVC criteria. However, I feel that the patient could benefit from inpatient admission or continuous assessment for further evaluation. Patient's mother petitioned the patient for IVC. First exam not completed at this time.   Ativan 1 mg oral x 1 dose for anxiety  Agitation protocol -geodon 20 mg IM every 12 hours prn -ativan 1 mg oral every 8 hours prn  Meds ordered  this encounter  Medications   LORazepam (ATIVAN) tablet 1 mg   acetaminophen (TYLENOL) tablet 650 mg   alum & mag hydroxide-simeth (MAALOX/MYLANTA) 200-200-20 MG/5ML suspension 30 mL   magnesium hydroxide (MILK OF MAGNESIA) suspension 30 mL   hydrOXYzine (ATARAX) tablet 25 mg   traZODone (DESYREL) tablet 50 mg   AND Linked Order Group    ziprasidone (GEODON) injection 20 mg    LORazepam (ATIVAN) tablet 1 mg     Lab Orders         Resp Panel by RT-PCR (Flu A&B, Covid) Anterior Nasal Swab         CBC with Differential/Platelet         Comprehensive metabolic panel         Hemoglobin A1c         Ethanol         Lipid panel         TSH         Pregnancy, urine         POC SARS Coronavirus 2 Ag-ED - Nasal Swab         POCT Urine Drug Screen - (I-Screen)         POC SARS Coronavirus 2 Ag         Pregnancy, urine POC         Clinical Course as of 05/23/22 0501  Tue May 23, 2022  0437 POC Oxazepam (BZO)(!): Positive Patient received ativan prior to UDS collection [JB]    Clinical Course User Index [JB] Jackelyn Poling, NP     Recommendations  Based on my evaluation the patient does not appear to have an emergency medical condition.  Jackelyn Poling, NP 05/23/22  3:14 AM

## 2022-05-23 NOTE — ED Notes (Signed)
Pt was asking "who IVC'd me"  ,"why am I here illegally".  Pt was instructed that it was reported that she was not sleeping or taking care of herself and that she was admitted to [facility] involuntary.   Pt continues to state that she is her illegally and presents with blunted affect.  Pt offered medications but refused.

## 2022-05-23 NOTE — Progress Notes (Signed)
Patient has been denied by [facility] due to no appropriate beds available. Patient meets [facility] inpatient criteria per Dr. Serafina Mitchell. Patient has been faxed out to the following facilities:   [facility]  [place], [place] [address] [place]  [facility]  [address] [place] [address] [phone] [place]  [address] [place] [address] [phone]  [place]  [address], [place] [address] [phone] [place]  [address] [place] [address] [phone]  [facility]  [address], [place] [address] [phone] [facility]  [place], [place] [address] [phone]  [facility]  [address] [place], [place] [address] [phone]  [place] [place] [address] [phone] [facility]  [address], [place] [address] [phone]  [facility]  [address] [place] [address] [place]  [facility]  [address], [place] [address] [phone] [place], MSW, LCSW-A  12:39 PM 05/23/2022

## 2022-05-23 NOTE — ED Notes (Signed)
Pt resting quietly at this time

## 2022-05-24 MED ORDER — NICOTINE 7 MG/24HR TD PT24: 7.0000 mg | MEDICATED_PATCH | Freq: Every day | TRANSDERMAL | 0 refills | Status: DC | PRN

## 2022-05-24 MED ORDER — LORAZEPAM 1 MG PO TABS: 2.0000 mg | ORAL_TABLET | Freq: Once | ORAL | Status: DC | PRN

## 2022-05-24 MED ORDER — PALIPERIDONE ER 6 MG PO TB24: 6.0000 mg | ORAL_TABLET | Freq: Every day | ORAL | Status: DC

## 2022-05-24 MED ORDER — TRAZODONE HCL 50 MG PO TABS: 50.0000 mg | ORAL_TABLET | Freq: Every evening | ORAL | Status: DC | PRN

## 2022-05-24 MED ORDER — LORAZEPAM 1 MG PO TABS
2.0000 mg | ORAL_TABLET | Freq: Once | ORAL | Status: AC | PRN
  Administered 2022-05-24: 2 mg via ORAL
  Filled 2022-05-24: qty 2

## 2022-05-24 MED ORDER — NICOTINE POLACRILEX 2 MG MT GUM: 2.0000 mg | CHEWING_GUM | OROMUCOSAL | 0 refills | Status: DC | PRN

## 2022-05-24 NOTE — ED Notes (Signed)
Pt laying in bed eyes open staring at wall. Pt does not respond to questions asked by nursing staff.  Staff informed pt she would be transferring to [facility] later in the morning. Breakfast provided.  Unable to determine SI, HI, and AVH at this time.  Breathing is even and unlabored.  Will continue to monitor for safety.

## 2022-05-24 NOTE — ED Notes (Signed)
Lying in bed quietly no distress noted affect flat mood quiet minimal interaction with others.

## 2022-05-24 NOTE — ED Provider Notes (Addendum)
Unable to get in contact with mom Kayly Kriegel at [phone] to update that patient will be transferred to [facility] 05/24/2022.  Update, was able to get in contact with mom to update after 2nd attempt.  Signed: Princess Bruins, DO Psychiatry Resident, PGY-1 [facility] 05/24/2022, 9:41 AM

## 2022-05-24 NOTE — ED Notes (Signed)
[facility] to [facility]  Pt is IVC.

## 2022-05-24 NOTE — ED Notes (Addendum)
Patient resting.

## 2022-05-24 NOTE — Discharge Instructions (Addendum)
[facility] to [facility]
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
## 2022-06-09 ENCOUNTER — Telehealth (HOSPITAL_COMMUNITY): Payer: Self-pay

## 2022-06-09 NOTE — BH Assessment (Signed)
Care Management - Follow Up [facility] Discharges   Patient has been placed in an inpatient psychiatric hospital ([facility]) on 05-24-2022.

## 2023-09-06 NOTE — Progress Notes (Signed)
 [facility] Video Visit   Patient ID:  Kashay Cavenaugh is a 27 y.o. 10-21-1996 female. Place of service: patient home Patient has been advised as to the limitations and limited nature of physical exam due to nature of a video visit, the possibility of privacy risk in the use of a video visit, and that the healthcare provider may recommend visiting a healthcare clinic for in-person care and follow up.  Video Visit Assessment and Plan   1. Tongue irritation (Primary) Other orders -     nystatin (MYCOSTATIN) 100000 UNIT/ML suspension; Take 5 mLs (500,000 Units dose) by mouth 4 (four) times a day for 10 days. Swish gargle and spit, Starting Thu 09/06/2023, Until Sun 09/16/2023, Normal   Will cover for possible early thrush in setting of recent antibiotic use Medicine sent to pharmacy [facility] Follow up with primary care provider as needed Message with questions  Patient's Medications    Patient's Medications       * Accurate as of September 06, 2023 12:13 PM. Reflects encounter med changes as of last refresh          New Prescriptions      Instructions  nystatin 100000 UNIT/ML suspension Commonly known as: MYCOSTATIN  500,000 Units, Oral, 4 times daily, Swish gargle and spit       Continued Medications      Instructions  albuterol sulfate 2.5 mg/3 mL nebulizer solution Commonly known as: PROVENTIL  2.5 mg, Nebulization, Every 6 hours as needed   * ARIPiprazole 5 mg tablet Commonly known as: ABILIFY  5 mg, Oral, Daily   * ARIPiprazole 2 mg tablet Commonly known as: ABILIFY  SMARTSIG:0.5-1 Tablet(s) By Mouth Daily   ibuprofen  800 mg tablet Commonly known as: ADVIL ,MOTRIN   800 mg, Oral, Every 8 hours as needed   misoprostol 200 MCG tablet Commonly known as: CYTOTEC  Take 800mcg po today.  May repeat dose in 12-24 hours if no bleeding.   Prenatal Vitamins 28-0.8 MG Tabs  1 tablet, Oral, Daily   promethazine 12.5 MG tablet Commonly known as:  PHENERGAN  12.5 mg, Oral, Every 6 hours as needed      * * This list has 2 medication(s) that are the same as other medications prescribed for you. Read the directions carefully, and ask your doctor or other care provider to review them with you.              Risk, benefits, and alternatives were provided through patient instructions given to the patient electronically and during the video interaction.  If any worsening symptoms or lack of improvement, the patient will seek immediate medical care.  Video Visit History   HPI Chief Complaint  Patient presents with  . Oral Pain    Tongue irritation began this morning after brushing teeth, no swelling or tingling, denies discoloration   This morning after brushing teeth noticed tongue irritated and hurts. No swelling to tongue. Reports her tongue appears slightly discolored. No recent uri. Reports had recent dental pain and taken antibiotic. No fever or chills.    Reviewed and updated this visit by provider: Tobacco  Allergies  Meds  Med Hx  OB Hx  SE Hx  Surg Hx  Fam Hx  Soc Hx       ROS: As documented in the history above, all other relevant system complaints were negative.  Video Visit Objective Findings  Examination conducted with the use of video cameras/computer monitors. Vital signs and other aspects of  physical exam are limited due to the nature of this encounter.   Constitutional: No apparent acute distress noted during the video interaction; Alert and oriented with normal mentation and verbally interactive. Mood: Appears appropriate to situation. Tongue pink with lighter pink/whitish nearing back of tongue  No LOS data to display   *Some images could not be shown.

## 2023-09-08 NOTE — ED Provider Notes (Signed)
 I agree with the APP's history and physical. I personally approved the management plan and take responsibility for the patient management.   I independently reviewed and interpreted the nursing notes, vital signs, oxygen saturation and laboratory data as follows:  Vitals:   09/08/23 1947  BP: (!) 145/91  Pulse: 109  Resp: 16  Temp: 98.1 F (36.7 C)  TempSrc: Oral  SpO2: 95%  Weight: 108 kg (238 lb)    O2 Saturation Physician Interpretation: Stable, not hypoxic  Admission on 09/08/2023, Discharged on 09/09/2023  Component Date Value Ref Range Status  . WBC 09/08/2023 15.1 (H)  4.0 - 10.5 thou/mcL Final  . RBC 09/08/2023 4.34  3.93 - 5.22 million/mcL Final  . HGB 09/08/2023 13.5  11.2 - 15.7 gm/dL Final  . HCT 90/78/7975 39.8  34.1 - 44.9 % Final  . MCV 09/08/2023 91.7  79.4 - 94.8 fL Final  . MCH 09/08/2023 31.1  25.6 - 32.2 pg Final  . MCHC 09/08/2023 33.9  32.2 - 35.5 gm/dL Final  . Plt Ct 90/78/7975 348  150 - 400 thou/mcL Final  . RDW SD 09/08/2023 43.0  35.1 - 46.3 fL Final  . MPV 09/08/2023 9.6  9.4 - 12.4 fL Final  . NRBC% 09/08/2023 0.0  0.0 - 0.2 /100WBC Final  . Absolute NRBC Count 09/08/2023 0.00  0.00 - 0.01 thou/mcL Final  . NEUTROPHIL % 09/08/2023 60.8  % Final  . LYMPHOCYTE % 09/08/2023 26.5  % Final  . MONOCYTE % 09/08/2023 7.5  % Final  . Eosinophil % 09/08/2023 4.4  % Final  . BASOPHIL % 09/08/2023 0.5  % Final  . IG% 09/08/2023 0.3  % Final  . ABSOLUTE NEUTROPHIL COUNT 09/08/2023 9.15 (H)  1.50 - 7.50 thou/mcL Final  . ABSOLUTE LYMPHOCYTE COUNT 09/08/2023 4.00  1.00 - 4.50 thou/mcL Final  . Absolute Monocyte Count 09/08/2023 1.13 (H)  0.10 - 0.80 thou/mcL Final  . Absolute Eosinophil Count 09/08/2023 0.67 (H)  0.00 - 0.50 thou/mcL Final  . Absolute Basophil Count 09/08/2023 0.08  0.00 - 0.20 thou/mcL Final  . Absolute Immature Granulocyte Count 09/08/2023 0.04 (H)  0.00 - 0.03 thou/mcL  Final  . Na 09/08/2023 138  136 - 146 mmol/L Final  . Potassium  09/08/2023 3.9  3.7 - 5.4 mmol/L Final  . Cl 09/08/2023 104  97 - 108 mmol/L Final  . CO2 09/08/2023 23  20 - 32 mmol/L Final  . AGAP 09/08/2023 11  7 - 16 mmol/L Final  . Glucose 09/08/2023 110 (H)  65 - 99 mg/dL Final  . BUN 90/78/7975 11  6 - 20 mg/dL Final  . Creatinine 90/78/7975 1.02 (H)  0.57 - 1.00 mg/dL Final  . Ca 90/78/7975 9.3  8.7 - 10.2 mg/dL Final  . ALK PHOS 90/78/7975 62  25 - 150 U/L Final  . T Bili 09/08/2023 0.18  0.00 - 1.20 mg/dL Final  . Total Protein 09/08/2023 7.7  6.0 - 8.5 gm/dL Final  . Alb 90/78/7975 3.9  3.5 - 5.5 gm/dL Final  . GLOBULIN 90/78/7975 3.8  1.5 - 4.5 gm/dL Final  . ALBUMIN/GLOBULIN RATIO 09/08/2023 1.0 (L)  1.1 - 2.5 Final  . BUN/CREAT RATIO 09/08/2023 10.8 (L)  11.0 - 26.0 Final  . ALT 09/08/2023 18  0 - 40 U/L Final  . AST 09/08/2023 23  0 - 40 U/L Final  . eGFR 09/08/2023 78  mL/min/1.34m2 Final  . B HCG Quant 09/08/2023 954  mIU/mL Final  . ABO Rh  Type 09/08/2023 A POS   Final  . RhIg Status 09/08/2023 NO   Final  Ancillary Procedure on 09/04/2023  Component Date Value Ref Range Status  . Patient Height 09/04/2023 65   Final      Medical Decision Making (MDM):  Patient seen for pregnancy evaluation Patient left before treatment complete  [facility]  ED Provider Note  Ahuva Poynor 27 y.o. female DOB: [birth year] MRN: [id number]  Patient presents requesting an US  - states that she was told she was having a miscarriage on 09/04/23 at when she went to see OBGYN. States that she had an US  showing an empty GS no YS or fetal pole. She was given expectant management however also given medicine to kill my baby. When I look at records it appears that she was given Cytotech - states that she only took a 200mcg dose and then did not take any further. Has had some mild bleeding and cramping.   LMP 05/14/23. G4P3  ROS: see above   Exam: Awake and alert, no distress.   Patient was evaluated by the  provider in triage and a medical screening examination was initiated. Based on brief HPI and physical exam documented below, further evaluation and treatment are required in the main Emergency Department. Patient placed in the external observation area until a treatment room comes available. Patient was advised of the importance of staying to complete the emergency medical evaluation and any needed therapeutic treatments.  Medical screening initiated and orders placed by Landis KATHEE Meissner, PA-C. 09/08/2023 / 7:43 PM  History   Chief Complaint  Patient presents with  . Possible Pregnancy    [facility]--had US  done that showed no fetal pole. Pt thinks she's 6wks. Upset that they gave her cytotec, wants an US  tonight as a 2nd opinion. LMP 5/27.   HPI    Past Medical History:  Diagnosis Date  . Bipolar 1 disorder (*)   . PTSD (post-traumatic stress disorder)     History reviewed. No pertinent surgical history.  Social History   Substance and Sexual Activity  Alcohol Use Not Currently   Social History   Tobacco Use  Smoking Status Every Day  . Packs/day: 0.50  . Years: 7.00  . Additional pack years: 0.00  . Total pack years: 3.50  . Types: Cigarettes  . Passive exposure: Current  Smokeless Tobacco Never   E-Cigarettes  . Vaping Use Never User   . Start Date    . Cartridges/Day    . Quit Date     Social History   Substance and Sexual Activity  Drug Use Not Currently         No Known Allergies  Home Medications   ALBUTEROL SULFATE (PROVENTIL) 2.5 MG/3 ML NEBULIZER SOLUTION    Take 3 mLs (2.5 mg dose) by nebulization every 6 (six) hours as needed for Wheezing.   ARIPIPRAZOLE (ABILIFY) 2 MG TABLET    SMARTSIG:0.5-1 Tablet(s) By Mouth Daily   ARIPIPRAZOLE (ABILIFY) 5 MG TABLET    Take one tablet (5 mg dose) by mouth daily.   IBUPROFEN  (ADVIL ,MOTRIN ) 800 MG TABLET    Take one tablet (800 mg dose) by mouth every 8 (eight) hours as needed for Pain.    MISOPROSTOL (CYTOTEC) 200 MCG TABLET    Take 800mcg po today.  May repeat dose in 12-24 hours if no bleeding.   NYSTATIN (MYCOSTATIN) 100000 UNIT/ML SUSPENSION    Take 5 mLs (500,000 Units  dose) by mouth 4 (four) times a day for 10 days. Swish gargle and spit   PRENATAL VIT-FE FUMARATE-FA (PRENATAL VITAMINS) 28-0.8 MG TABS    Take 1 tablet by mouth daily.   PROMETHAZINE (PHENERGAN) 12.5 MG TABLET    Take one tablet (12.5 mg dose) by mouth every 6 (six) hours as needed for Nausea.    Review of Systems   Review of Systems  Physical Exam   ED Triage Vitals [09/08/23 1947]  BP (!) 145/91  Heart Rate 109  Resp 16  SpO2 95 %  Temp 98.1 F (36.7 C)    Physical Exam  ED Course   Lab results: No data to display  Imaging: No data to display  ECG: ECG Results   None                               Pre-Sedation Procedures    Medical Decision Making       Provider Communication  New Prescriptions   No medications on file    Modified Medications   No medications on file    Discontinued Medications   No medications on file    Clinical Impression Final diagnoses:  None    ED Disposition     None                 Electronically signed by:    Fairy Breed, DO 09/09/23 1940

## 2023-11-20 NOTE — ED Triage Notes (Signed)
 Pregnant [redacted] weeks.Here with a c/o vaginal bleed and abdominal pain today.

## 2023-11-20 NOTE — ED Provider Notes (Signed)
 Emergency Department Provider Note    ED Clinical Impression   Final diagnoses:  Abdominal pain, unspecified abdominal location (Primary)    ED Assessment/Plan    Condition: Stable Disposition: Eloped  This chart has been completed using Engineer, civil (consulting) software, and while attempts have been made to ensure accuracy, certain words and phrases may not be transcribed as intended.   History   Chief Complaint  Patient presents with  . Vaginal Bleeding  . Abdominal Pain    Vaginal Bleeding Associated symptoms: abdominal pain   Abdominal Pain Associated symptoms: vaginal bleeding     Eileen Moody is a 27 y.o. female  who presents today to the  emergency department complaining of abdominal pain and vaginal bleeding.  She reports that she is [redacted] weeks pregnant, last menstrual period was November 2.  She reports 1 episode of passing a blood clot but denies frank vaginal bleeding.  Denies dysuria, urgency, frequency, back pain, nausea, vomiting, fevers.  She reports lower abdominal pain.  She has not seen the OB/GYN    Allergies: has no known allergies. Medications: is not on any long-term medications. PMHx:  has a past medical history of Depression and No known health problems. PSHx:  has no past surgical history on file. SocHx:  reports that she has never smoked. She does not have any smokeless tobacco history on file. She reports that she does not drink alcohol and does not use drugs. Allergies, Medications, Medical, Surgical, and Social History were reviewed as documented above.   Social Drivers of Health with Concerns   Food Insecurity: Food Insecurity Present (01/29/2023)   Received from [facility]   Hunger Vital Sign   . Worried About Programme researcher, broadcasting/film/video in the Last Year: Sometimes true   . Ran Out of Food in the Last Year: Sometimes true  Internet Connectivity: Not on  file  Housing/Utilities: Not on file  Tobacco Use: Unknown (11/20/2023)   Patient History   . Smoking Tobacco Use: Never   . Smokeless Tobacco Use: Unknown   . Passive Exposure: Not on file  Recent Concern: Tobacco Use - Medium Risk (09/23/2023)   Received from [facility]   Patient History   . Smoking Tobacco Use: Former   . Smokeless Tobacco Use: Never   . Passive Exposure: Not on file  Interpersonal Safety: Not on file  Physical Activity: Insufficiently Active (01/29/2023)   Received from [facility]   Exercise Vital Sign   . Days of Exercise per Week: 2 days   . Minutes of Exercise per Session: 30 min  Substance Use: Not on file (10/29/2023)  Health Literacy: Not on file     Review Of Systems  Review of Systems  Gastrointestinal:  Positive for abdominal pain.  Genitourinary:  Positive for vaginal bleeding.  All other systems reviewed and are negative.   Physical Exam   BP 133/79   Pulse 98   Temp 36.2 C (97.1 F) (Temporal)   Resp 16   Ht 165.1 cm (5' 5)   Wt 77.1 kg (170 lb)   LMP 10/20/2023 (Exact Date)   SpO2 98%   BMI 28.29 kg/m   Physical Exam Vitals and nursing note reviewed.  Constitutional:      Appearance: She is well-developed.  HENT:  Head: Normocephalic.     Mouth/Throat:     Pharynx: Oropharynx is clear.  Eyes:     Conjunctiva/sclera: Conjunctivae normal.  Cardiovascular:     Rate and Rhythm: Normal rate.     Pulses: Normal pulses.  Pulmonary:     Effort: Pulmonary effort is normal. No respiratory distress.  Abdominal:     Palpations: Abdomen is soft.     Comments: Mild suprapubic TTP, no CVA TTP, no rebound or guarding  Musculoskeletal:        General: Normal range of motion.     Cervical back: Normal range of motion and neck supple.  Skin:    General: Skin is warm.     Capillary Refill: Capillary refill takes less than 2 seconds.  Neurological:     General: No focal deficit present.     Mental Status: She is alert.      ED Course  Medical Decision Making 27 year old female presents with abdominal pain, vaginal bleeding.  She reports she is [redacted] weeks pregnant and last menstrual period was November 2.  She has mild suprapubic tenderness on exam, will check UA and beta quant  Patient was given a specimen cup to give a urine sample but then left the emergency department prior to completing her workup.     Procedures   No results found for this visit on 11/20/23 (from the past 4464 hours).   ED Results No results found for any visits on 11/20/23. No results found.  Medications Administered: Medications - No data to display  Discharge Medications (Medications Prescribed during this  ED visit and Patient's Home Medications) :    Your Medication List     ASK your doctor about these medications    docusate sodium  100 MG capsule Commonly known as: COLACE Take 1 capsule (100 mg total) by mouth daily.   ibuprofen  600 MG tablet Commonly known as: MOTRIN  Take 1 tablet (600 mg total) by mouth every six (6) hours as needed.   prenat.vits,cal,min-iron-folic Tab tablet Take 1 tablet by mouth daily.          Peri Jackee Victory Mickey., MD 11/21/23 [phone]

## 2023-12-21 NOTE — Telephone Encounter (Signed)
 Patient would like to schedule for infertility, patient has medicaid, I advised her off $300 fee upfront, please assist

## 2023-12-21 NOTE — Telephone Encounter (Signed)
 Called pt for NPV.  LVM for pt to call back when ready to be scheduled.

## 2024-01-22 NOTE — Progress Notes (Signed)
   [facility] Video Visit   Patient ID:  Eileen Moody is a 28 y.o. August 10, 1996 female. Place of service: patient home Patient has been advised as to the limitations and limited nature of physical exam due to nature of a video visit, the possibility of privacy risk in the use of a video visit, and that the healthcare provider may recommend visiting a healthcare clinic for in-person care and follow up.  Video Visit Assessment and Plan   1. Vaginal discharge (Primary)    Unclear etiology Patient has been referred to Urgent Care for further evaluation.    Patient's Medications    Patient's Medications       * Accurate as of January 22, 2024  8:41 AM. Reflects encounter med changes as of last refresh          Continued Medications      Instructions  albuterol sulfate 2.5 mg/3 mL nebulizer solution Commonly known as: PROVENTIL  2.5 mg, Nebulization, Every 6 hours as needed   * ARIPiprazole 5 mg tablet Commonly known as: ABILIFY  5 mg, Oral, Daily   * ARIPiprazole 2 mg tablet Commonly known as: ABILIFY  SMARTSIG:0.5-1 Tablet(s) By Mouth Daily   ibuprofen  800 mg tablet Commonly known as: ADVIL ,MOTRIN   800 mg, Oral, Every 8 hours as needed   medroxyPROGESTERone acetate 10 mg tablet Commonly known as: PROVERA  10 mg, Oral, Daily   misoprostol 200 MCG tablet Commonly known as: CYTOTEC  Take 800mcg po today.  May repeat dose in 12-24 hours if no bleeding.   Prenatal Vitamins 28-0.8 MG Tabs  1 tablet, Oral, Daily   promethazine 12.5 MG tablet Commonly known as: PHENERGAN  12.5 mg, Oral, Every 6 hours as needed      * * This list has 2 medication(s) that are the same as other medications prescribed for you. Read the directions carefully, and ask your doctor or other care provider to review them with you.              Risk, benefits, and alternatives were provided through patient instructions given to the patient electronically and during  the video interaction.  If any worsening symptoms or lack of improvement, the patient will seek immediate medical care.  Video Visit History   HPI Chief Complaint  Patient presents with  . Vaginitis    Yellow discharge X started today. White discharge X 1 week. Denies vaginal itching, fishy odor, diarrhea. OTC Monistat, boric acid last week.    No pelvic pain.  Unsure STI concerns.    Reviewed and updated this visit by provider: Tobacco  Allergies  Meds  Problems  Med Hx  Surg Hx  Fam Hx        ROS: As documented in the history above, all other relevant system complaints were negative.  Video Visit Objective Findings  Examination conducted with the use of video cameras/computer monitors. Vital signs and other aspects of physical exam are limited due to the nature of this encounter.   Constitutional: No apparent acute distress noted during the video interaction; Alert and oriented with normal mentation and verbally interactive. Mood: Appears appropriate to situation.  *Some images could not be shown.

## 2024-06-20 ENCOUNTER — Emergency Department
Admission: EM | Admit: 2024-06-20 | Discharge: 2024-06-24 | Disposition: A | Discharge status: Other Acute Inpatient Hospital | Payer: MEDICAID | Attending: Emergency Medicine | Admitting: Emergency Medicine

## 2024-06-20 ENCOUNTER — Other Ambulatory Visit: Payer: Self-pay

## 2024-06-20 DIAGNOSIS — O26891 Other specified pregnancy related conditions, first trimester: Secondary | ICD-10-CM | POA: Diagnosis present

## 2024-06-20 DIAGNOSIS — F3160 Bipolar disorder, current episode mixed, unspecified: Secondary | ICD-10-CM | POA: Diagnosis not present

## 2024-06-20 DIAGNOSIS — O99341 Other mental disorders complicating pregnancy, first trimester: Secondary | ICD-10-CM | POA: Diagnosis not present

## 2024-06-20 DIAGNOSIS — Z3A10 10 weeks gestation of pregnancy: Secondary | ICD-10-CM | POA: Diagnosis not present

## 2024-06-20 DIAGNOSIS — R4689 Other symptoms and signs involving appearance and behavior: Secondary | ICD-10-CM

## 2024-06-20 DIAGNOSIS — F3164 Bipolar disorder, current episode mixed, severe, with psychotic features: Secondary | ICD-10-CM | POA: Insufficient documentation

## 2024-06-20 LAB — URINE DRUG SCREEN, QUALITATIVE (ARMC ONLY)
Amphetamines, Ur Screen: NOT DETECTED
Barbiturates, Ur Screen: NOT DETECTED
Benzodiazepine, Ur Scrn: NOT DETECTED
Cannabinoid 50 Ng, Ur ~~LOC~~: NOT DETECTED
Cocaine Metabolite,Ur ~~LOC~~: NOT DETECTED
MDMA (Ecstasy)Ur Screen: NOT DETECTED
Methadone Scn, Ur: NOT DETECTED
Opiate, Ur Screen: NOT DETECTED
Phencyclidine (PCP) Ur S: NOT DETECTED
Tricyclic, Ur Screen: NOT DETECTED

## 2024-06-20 LAB — COMPREHENSIVE METABOLIC PANEL WITH GFR
ALT: 24 U/L (ref 0–44)
AST: 26 U/L (ref 15–41)
Albumin: 3.4 g/dL — ABNORMAL LOW (ref 3.5–5.0)
Alkaline Phosphatase: 51 U/L (ref 38–126)
Anion gap: 9 (ref 5–15)
BUN: 9 mg/dL (ref 6–20)
CO2: 23 mmol/L (ref 22–32)
Calcium: 8.6 mg/dL — ABNORMAL LOW (ref 8.9–10.3)
Chloride: 105 mmol/L (ref 98–111)
Creatinine, Ser: 0.84 mg/dL (ref 0.44–1.00)
GFR, Estimated: >60 mL/min (ref >60)
Glucose, Bld: 96 mg/dL (ref 70–99)
Potassium: 3.4 mmol/L — ABNORMAL LOW (ref 3.5–5.1)
Sodium: 137 mmol/L (ref 135–145)
Total Bilirubin: 0.4 mg/dL (ref 0.0–1.2)
Total Protein: 7.6 g/dL (ref 6.5–8.1)

## 2024-06-20 LAB — CBC
HCT: 39.3 % (ref 36.0–46.0)
Hemoglobin: 13.5 g/dL (ref 12.0–15.0)
MCH: 31 pg (ref 26.0–34.0)
MCHC: 34.4 g/dL (ref 30.0–36.0)
MCV: 90.3 fL (ref 80.0–100.0)
Platelets: 338 K/uL (ref 150–400)
RBC: 4.35 MIL/uL (ref 3.87–5.11)
RDW: 13.1 % (ref 11.5–15.5)
WBC: 19.3 K/uL — ABNORMAL HIGH (ref 4.0–10.5)
nRBC: 0 % (ref 0.0–0.2)

## 2024-06-20 LAB — POC URINE PREG, ED: Preg Test, Ur: POSITIVE — AB

## 2024-06-20 LAB — ETHANOL: Alcohol, Ethyl (B): <15 mg/dL (ref <15)

## 2024-06-20 MED ORDER — MELATONIN 5 MG PO TABS: 5.0000 mg | ORAL_TABLET | Freq: Every day | ORAL | Status: DC

## 2024-06-20 MED ORDER — MELATONIN 5 MG PO TABS
2.5000 mg | ORAL_TABLET | Freq: Every day | ORAL | Status: DC
  Administered 2024-06-20 – 2024-06-23 (×2): 2.5 mg via ORAL
  Filled 2024-06-20 (×3): qty 1

## 2024-06-20 NOTE — ED Notes (Signed)
 Parents took all belongings home with them

## 2024-06-20 NOTE — ED Notes (Signed)
 IVC/pending psych consult

## 2024-06-20 NOTE — ED Triage Notes (Signed)
 Pt has hx of schizophrenia, unmedicated x1 year. Parents report manic behavior and psychosis. Pt endorses AV hallucinations. Pregnant 3 months - no prenatal care as of yet. G5 P3 A1  Pt started on quetiapine  yesterday.

## 2024-06-20 NOTE — ED Notes (Signed)
 Patient moved to BHU 6 from main ED.  Patient oriented to unit regarding rounds and cameras.  Patient asking when she is leaving.  Explained to patient that she would have to speak with psychiatrist and then a decision would be made.

## 2024-06-20 NOTE — ED Notes (Signed)
 Pt given dinner tray at this time.

## 2024-06-20 NOTE — ED Provider Notes (Signed)
 [facility] Provider Note    Event Date/Time   First MD Initiated Contact with Patient 06/20/24 1647     (approximate)   History   Mental Health Problem   HPI  Eileen Moody is a 28 y.o. female with a history of bipolar disorder who presents with concern for worsening psychiatric symptoms.  Per the parents, the patient has not been on medication for the last year.  She is 3 months pregnant.  She has had some auditory and visual hallucinations.  She was started on quetiapine  yesterday although it has not taken effect.  The patient herself is unable to give much history.  She denies any acute complaints other than the hallucinations and wants to go home.  However she also states non sequitur such as stating that she does not want to get a sex change when this was not discussed with her.  I reviewed the past medical records.  The patient's most recent outpatient encounter that I have access to was at [facility] health telemedicine on 2/4 of this year for vaginal discharge.  She has no recent psychiatric admissions.   Physical Exam   Triage Vital Signs: ED Triage Vitals [06/20/24 1626]  Encounter Vitals Group     BP (!) 128/90     Girls Systolic BP Percentile      Girls Diastolic BP Percentile      Boys Systolic BP Percentile      Boys Diastolic BP Percentile      Pulse Rate (!) 112     Resp 20     Temp 99.3 F (37.4 C)     Temp Source Oral     SpO2 98 %     Weight      Height 5' 5 (1.651 m)     Head Circumference      Peak Flow      Pain Score 0     Pain Loc      Pain Education      Exclude from Growth Chart     Most recent vital signs: Vitals:   06/20/24 1626 06/20/24 2132  BP: (!) 128/90 (!) 115/59  Pulse: (!) 112 95  Resp: 20 17  Temp: 99.3 F (37.4 C) 98.5 F (36.9 C)  SpO2: 98% 97%     General: Alert, no distress.  CV:  Good peripheral perfusion.  Resp:  Normal effort.  Abd:  No distention.  Other:  Intact in all extremities.   Normal gait.  Anxious appearing, somewhat tangential and disorganized thought.   ED Results / Procedures / Treatments   Labs (all labs ordered are listed, but only abnormal results are displayed) Labs Reviewed  COMPREHENSIVE METABOLIC PANEL WITH GFR - Abnormal; Notable for the following components:      Result Value   Potassium 3.4 (*)    Calcium 8.6 (*)    Albumin 3.4 (*)    All other components within normal limits  CBC - Abnormal; Notable for the following components:   WBC 19.3 (*)    All other components within normal limits  POC URINE PREG, ED - Abnormal; Notable for the following components:   Preg Test, Ur Positive (*)    All other components within normal limits  ETHANOL  URINE DRUG SCREEN, QUALITATIVE ([facility] ONLY)     EKG    RADIOLOGY    PROCEDURES:  Critical Care performed: No  Procedures   MEDICATIONS ORDERED IN ED: Medications  melatonin tablet 2.5 mg (2.5  mg Oral Given 06/20/24 2129)     IMPRESSION / MDM / ASSESSMENT AND PLAN / ED COURSE  I reviewed the triage vital signs and the nursing notes.  28 year old female with PMH as noted above presents with worsening psychiatric symptoms including hallucinations and paranoid thoughts.  She was restarted on quetiapine  yesterday.  She is 3 months pregnant.  Differential diagnosis includes, but is not limited to, bipolar disorder, possible acute psychosis, less likely substance-induced symptoms.  We will obtain lab workup for medical clearance, psychiatry and TTS consults, and reassess.  The patient is making nonsensical statements, so I am concerned for acute psychosis.  She appears at high risk to try to leave.  I have placed her under involuntary commitment due to concern for acute danger to self.  Patient's presentation is most consistent with severe exacerbation of chronic illness.  The patient has been placed in psychiatric observation due to the need to provide a safe environment for the patient while  obtaining psychiatric consultation and evaluation, as well as ongoing medical and medication management to treat the patient's condition.  The patient has been placed under full IVC at this time.   ----------------------------------------- 11:44 PM on 06/20/2024 -----------------------------------------  Lab workup is significant for elevated WBC count which appears somewhat chronic.  CMP shows no acute findings.  UDS and ethanol are negative.  Psychiatry consult is pending.    FINAL CLINICAL IMPRESSION(S) / ED DIAGNOSES   Final diagnoses:  Behavior concern     Rx / DC Orders   ED Discharge Orders     None        Note:  This document was prepared using Dragon voice recognition software and may include unintentional dictation errors.    Jacolyn Pae, MD 06/20/24 [phone]

## 2024-06-20 NOTE — ED Notes (Signed)
 Patient asking when she will be able to go home.  Explained to patient that she would need to speak to psychiatry and they will make that decision.

## 2024-06-20 NOTE — ED Notes (Signed)
 Snacks and water given to pt
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
## 2024-06-21 DIAGNOSIS — Z3A10 10 weeks gestation of pregnancy: Secondary | ICD-10-CM

## 2024-06-21 DIAGNOSIS — F3160 Bipolar disorder, current episode mixed, unspecified: Secondary | ICD-10-CM

## 2024-06-21 MED ORDER — ZIPRASIDONE MESYLATE 20 MG IM SOLR
10.0000 mg | Freq: Four times a day (QID) | INTRAMUSCULAR | Status: DC | PRN
  Administered 2024-06-22: 10 mg via INTRAMUSCULAR
  Filled 2024-06-21: qty 20

## 2024-06-21 MED ORDER — DIAZEPAM 5 MG/ML IJ SOLN: 10.0000 mg | Freq: Four times a day (QID) | INTRAMUSCULAR | Status: DC | PRN

## 2024-06-21 MED ORDER — PRENATAL MULTIVITAMIN CH
1.0000 | ORAL_TABLET | Freq: Every day | ORAL | Status: DC
  Administered 2024-06-22 – 2024-06-24 (×3): 1 via ORAL
  Filled 2024-06-21 (×3): qty 1

## 2024-06-21 MED ORDER — PRENATAL MULTIVITAMIN CH
1.0000 | ORAL_TABLET | Freq: Once | ORAL | Status: AC
  Administered 2024-06-21: 1 via ORAL
  Filled 2024-06-21: qty 1

## 2024-06-21 MED ORDER — DIPHENHYDRAMINE HCL 25 MG PO CAPS
50.0000 mg | ORAL_CAPSULE | Freq: Four times a day (QID) | ORAL | Status: DC | PRN
  Filled 2024-06-21: qty 2

## 2024-06-21 NOTE — ED Notes (Signed)
 Pt asked about finding another place to stay tonight, informed pt she was IVC that she would have to wait until she spoke with psychiatry and they will make the decision on next steps of care. Pt also asked if she could sit in the dayroom and so I informed pt that she could not sit in the dayroom at night because of other pts sleeping at this time. Pt was also informed that she would be able to watch TV during television hours.

## 2024-06-21 NOTE — ED Notes (Signed)
 Pt with multiple requests to staff and then changes her mind. C/o of nose bleeding with nose blowing but no nose bleeding observed. Pt has been sitting in dayroom most of the day. Pt then c/o that something is wrong with her baby and she can feel it. VSS.

## 2024-06-21 NOTE — ED Notes (Signed)
 RN rolled TTS cart into pts room and stood outside room during the interview with security.

## 2024-06-21 NOTE — ED Notes (Signed)
 Snacks, cereal, icecream and cranberry juice given to pt.

## 2024-06-21 NOTE — ED Notes (Signed)
 Dinner tray and water given to pt

## 2024-06-21 NOTE — ED Notes (Signed)
Lunch tray served to pt.

## 2024-06-21 NOTE — ED Notes (Signed)
Pt in bed sleeping

## 2024-06-21 NOTE — BH Assessment (Signed)
 Destination  Service Provider Request Status Services Address Phone Fax Patient Preferred  [facility]  Pending - Request Sent -- [address] [place], [place] [address] [phone] --  [facility]  Pending - Request Sent -- [address] [place], [place] [address] [phone] --  [facility]  Pending - Request Sent -- [address], [place] [address] [phone] --  [facility] [address] [facility]  Pending - Request Sent -- [address] [place] [address] [phone] --  [facility]  Pending - Request Sent -- [address] [place] [address] [phone] --  [facility]  Pending - Request Sent -- [place] [phone] --  [facility]  Pending - Request Sent -- [address] [place] [address] [phone] --  [facility]  Pending - Request Sent -- [address] [place] [address] [phone] --  [facility]  Pending - Request Sent -- [address] [place] [address] [phone] --  [facility]  Pending - Request Sent -- [address] [place] [address] [phone] --

## 2024-06-21 NOTE — ED Notes (Signed)
 RN taking over care of pt at this time after receiving report from outgoing RN. Pt currently resting calmly in bed. Pt ABCs intact. RR even and unlabored. Pt in NAD.

## 2024-06-21 NOTE — ED Notes (Signed)
 Interview completed and tele-psych cart put back in [facility] nursing area.

## 2024-06-21 NOTE — ED Notes (Signed)
 Pt taking shower. Pt was given hygiene items and the following, 1 clean top, 1 clean bottom, with 1 pair of disposable underwear.  Pt changed out into clean clothing.  Staff disposed of all shower supplies.

## 2024-06-21 NOTE — ED Notes (Signed)
 Hospital meal provided, pt tolerated w/o complaints.  Waste discarded appropriately.
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
## 2024-06-21 NOTE — ED Notes (Signed)
 Patient asked to watch TV, informed pt it was not television hours that she would have to wait till TV hours.

## 2024-06-21 NOTE — ED Provider Notes (Signed)
 Emergency Medicine Observation Re-evaluation Note  Rokia Bosket is a 28 y.o. female, seen on rounds today.  Pt initially presented to the ED for complaints of Mental Health Problem Currently, the patient is resting in no acute distress.  Physical Exam  BP (!) 115/59 (BP Location: Left Arm)   Pulse 95   Temp 98.5 F (36.9 C) (Oral)   Resp 17   Ht 5' 5 (1.651 m)   LMP 05/01/2024 (Approximate)   SpO2 97%   BMI 26.63 kg/m  Physical Exam General: Resting in no acute distress  ED Course / MDM  EKG:   I have reviewed the labs performed to date as well as medications administered while in observation.  Recent changes in the last 24 hours include no acute events.  Plan  Current plan is for disposition per psychiatric service.    Clarine Ozell LABOR, MD 06/21/24 [phone]

## 2024-06-21 NOTE — ED Notes (Signed)
 RN received call from [facility] for report on pt. RN informed that the facility does not accept pregnant patients.

## 2024-06-21 NOTE — ED Notes (Signed)
 Snacks provided to pt

## 2024-06-21 NOTE — ED Notes (Signed)
 Again explained to patient that it was past TV times at this moment.  Oriented patient to time.

## 2024-06-21 NOTE — ED Notes (Signed)
 ivc/consult done/recommended for inpatient psychiatric hospitalization.

## 2024-06-22 MED ORDER — DIPHENHYDRAMINE HCL 25 MG PO CAPS: 100.0000 mg | ORAL_CAPSULE | Freq: Four times a day (QID) | ORAL | Status: DC | PRN

## 2024-06-22 MED ORDER — QUETIAPINE FUMARATE 25 MG PO TABS
100.0000 mg | ORAL_TABLET | Freq: Three times a day (TID) | ORAL | Status: DC
  Administered 2024-06-22 – 2024-06-24 (×6): 100 mg via ORAL
  Filled 2024-06-22 (×7): qty 4

## 2024-06-22 NOTE — ED Notes (Signed)
Given lunch

## 2024-06-22 NOTE — ED Provider Notes (Signed)
 Emergency Medicine Observation Re-evaluation Note  Eileen Moody is a 28 y.o. female, awaiting psych dispo  Physical Exam  BP 112/77   Pulse 81   Temp 98.2 F (36.8 C) (Oral)   Resp 19   Ht 5' 5 (1.651 m)   LMP 05/01/2024 (Approximate)   SpO2 95%   BMI 26.63 kg/m  Physical Exam General: calm  ED Course / MDM  No new labs in past 24 hours  Plan  Current plan is for dispo.    Floy Roberts, MD 06/22/24 [phone]

## 2024-06-22 NOTE — ED Notes (Signed)
 Received phone back from pt and was told that pt her prenatal vitamin is not available until noon. Pt is also stating that she hasn't seen a doctor and demanding to see one today.

## 2024-06-22 NOTE — ED Notes (Signed)
 Pt requesting their medicines. Pt informed that their prenatal vitamin is the only medication ordered for them at this time and it is not available until noon. Pt requesting to see MD.

## 2024-06-22 NOTE — ED Notes (Signed)
 Pt taking shower. Pt was given hygiene items and the following, 1 clean top, 1 clean bottom, with 1 pair of disposable underwear.  Pt changed out into clean clothing.  Staff disposed of all shower supplies.

## 2024-06-22 NOTE — ED Notes (Signed)
 IVC pending placement

## 2024-06-22 NOTE — ED Notes (Signed)
 Hospital meal provided, pt tolerated w/o complaints.  Waste discarded appropriately.

## 2024-06-22 NOTE — ED Notes (Signed)
Pt given the phone to make a phone call

## 2024-06-22 NOTE — BH Assessment (Signed)
 Referral information re-faxed and faxed to additional facilities for inpatient treatment.   Service Provider Phone  [facility]  [phone]  [facility]  [phone]  [facility]  [phone]  [facility]  [phone]  [facility]  [phone]  [facility]  [phone]  [facility]  [phone]  [facility]  [phone]  [facility]  [phone]  [facility]  [phone]  [facility]  [phone]  [facility]  [phone]  [facility]  [phone]  [facility]  [phone]  [place] [facility]  [phone]

## 2024-06-22 NOTE — ED Notes (Addendum)
 As soon as this RN exited pt's room, pt started banging on the door between pt's room and the day room stating kill me, kill me mutha fucking right now. If you don't do it I will. Pt then took blanket on their bed and put it around their throat and threatened to tighten it around her throat. This RN and security guard Namon tried to verbally de-escalate and redirect the pt and situation, to no avail. Pt continues to bang on the door.

## 2024-06-22 NOTE — ED Notes (Signed)
Snacks given to pt.

## 2024-06-22 NOTE — ED Notes (Signed)
 Pt banging on the door between the locked unit and the day room repeating I need the fucking phone. I need the mutha fucking phone on repeat.

## 2024-06-22 NOTE — ED Notes (Signed)
 Dinner tray with water given to pt

## 2024-06-22 NOTE — ED Notes (Addendum)
 Pt took PO meds without issue. Pt also apologized for her anger and outburst earlier. Stated you didn't deserve that. You were just doing your job. Pt then asked to use the phone and was educated about the phone times and was told that they would be able to use the phone during the next phone block for 10 minutes. Pt verbalized understanding.

## 2024-06-23 ENCOUNTER — Emergency Department: Payer: MEDICAID

## 2024-06-23 NOTE — ED Notes (Signed)
 Patient moved to room 2 for patient safety, patient calm and cooperative, no distress noted

## 2024-06-23 NOTE — ED Notes (Signed)
 IVC/ Pt pending placement

## 2024-06-23 NOTE — ED Provider Notes (Signed)
 Emergency Medicine Observation Re-evaluation Note  Eileen Moody is a 28 y.o. female, seen on rounds today.  Pt initially presented to the ED for complaints of Mental Health Problem Currently, the patient is resting.  Physical Exam  BP 120/77 (BP Location: Right Arm)   Pulse 85   Temp 98.6 F (37 C) (Oral)   Resp 18   Ht 5' 5 (1.651 m)   LMP 05/01/2024 (Approximate)   SpO2 100%   BMI 26.63 kg/m   General: No distress   ED Course / MDM  EKG:   I have reviewed the labs performed to date as well as medications administered while in observation.  Recent changes in the last 24 hours include none.  Plan  Current plan is for placement.    Claudene Rover, MD 06/23/24 [phone]

## 2024-06-23 NOTE — ED Notes (Signed)
 Pt provided with shower supplies and water turned on in three bed area of BHU. Door closed for her privacy.

## 2024-06-23 NOTE — ED Notes (Signed)
 IVC pending placement

## 2024-06-23 NOTE — BH Assessment (Signed)
 Referral information for Psychiatric Hospitalization faxed to;   [facility]  [phone]  [facility]  [phone]  [facility]  [phone]  [facility]  [phone]  [facility]  [phone]  [facility]  [phone]  [facility]  [phone]  [facility]  [phone]  [facility]  [phone]  [facility]  [phone]  [facility]  [phone]  [name]  [phone]  [facility]  [phone]  [facility]  [phone]  [place] [facility]  [phone]      [facility]  [phone]  [facility]  [phone]  [facility] Patient Placement  [phone]

## 2024-06-23 NOTE — Progress Notes (Signed)
 Per [facility] Danika, no bicu beds available.  Patient was referred to the following facilities:  Service Provider Phone  [facility]  [phone]  [facility]  [phone]  [name]  [phone]  [facility]  [phone]  [facility]  [phone]  [facility]  [phone]  [facility]  [phone]  [facility]  [phone]  [facility]  [phone]  [facility]  [phone]  [facility]  [phone]  [facility]  [phone]  [facility]  [phone]  [facility]  [phone]  [facility] Patient Placement  [phone], [place] [phone]

## 2024-06-23 NOTE — ED Notes (Signed)
 Pt back from ultrasound.

## 2024-06-23 NOTE — ED Notes (Signed)
Breakfast tray provided to pt.

## 2024-06-23 NOTE — ED Notes (Signed)
Snacks given to pt.

## 2024-06-23 NOTE — ED Notes (Signed)
 Pt to ultrasound, accompanied by Alfonso, RN and security.

## 2024-06-23 NOTE — ED Notes (Signed)
 Dinner tray provided

## 2024-06-23 NOTE — ED Notes (Signed)
 Lunch tray provided to pt.

## 2024-06-23 NOTE — ED Notes (Signed)
 Patient's husband visiting with pt in dayroom; pt advocate, Joe, present for visit.

## 2024-06-23 NOTE — Progress Notes (Addendum)
 UPDATE:  [facility] received call back from [place].  Elenor consulted with their Physician.  Silvano Potters is willing to accept patient if an ultrasound can be provided for review.  [facility] spoke to Alfonso, RN who will fax over ultrasound to [facility] [phone] - Fax).  Elenor confirmed receipt of ultrasound results and will update once reviewed by their Physician.   Carrie @ [place] called to review patient for placement.  Requested number to speak with nurse regarding prenatal and other clinical questions.  [place], [facility] [phone]

## 2024-06-24 NOTE — ED Notes (Addendum)
 Note entered in error

## 2024-06-24 NOTE — ED Notes (Signed)
 Patient now stating she will take medications after refusing earlier. This RN informed patient of what medications she was taking and patient stated I know, just give it to me. Patient very irritable with staff at this time.

## 2024-06-24 NOTE — ED Notes (Signed)
 Patient husband requesting to talk with patient. This RN attempted to provide patient phone to call husband and patient rolled eyes and stated no.SABRA

## 2024-06-24 NOTE — ED Notes (Signed)
 IVC pt pending placement after u/s review.

## 2024-06-24 NOTE — Progress Notes (Signed)
 Patient has been accepted to [facility] for 06/24/24. Patient assigned to Adult Unit. Accepting physician is Dr. Millie Manners. Call report to [phone] Option 2 (Leave Voicemail) Representative was [place].   ER Staff is aware of it: Joen, ER Secretary Dr. Gordan, ER MD Zelda, RN Patient's Nurse    Michial Skeen, [facility] [phone]

## 2024-06-24 NOTE — ED Provider Notes (Signed)
-----------------------------------------   12:26 PM on 06/24/2024 -----------------------------------------   Blood pressure 131/77, pulse 90, temperature 98.6 F (37 C), temperature source Oral, resp. rate 18, height 1.651 m (5' 5), last menstrual period 05/01/2024, SpO2 96%, unknown if currently breastfeeding.  The patient is calm and cooperative at this time.  Patient is being transferred to [facility], Darleene, MD 06/24/24 1226

## 2024-06-24 NOTE — ED Provider Notes (Signed)
 Emergency Medicine Observation Re-evaluation Note  Eileen Moody is a 28 y.o. female, seen on rounds today.  Pt initially presented to the ED for complaints of Mental Health Problem Currently, the patient is resting.  Physical Exam  BP 122/76 (BP Location: Right Arm)   Pulse 94   Temp 98.9 F (37.2 C) (Oral)   Resp 18   Ht 1.651 m (5' 5)   LMP 05/01/2024 (Approximate)   SpO2 100%   BMI 26.63 kg/m  Physical Exam Gen:  No acute distress Resp:  Breathing easily and comfortably, no accessory muscle usage Neuro:  Moving all four extremities, no gross focal neuro deficits Psych:  Resting currently, calm when awake  ED Course / MDM  EKG:   I have reviewed the labs performed to date as well as medications administered while in observation.  Recent changes in the last 24 hours include no clinically significant changes.  Plan  Current plan is for placement, possibly at [facility].    Gordan Huxley, MD 06/24/24 [phone]

## 2024-06-24 NOTE — ED Notes (Signed)
[facility]IFF  DEPT  CALLED  FOR  TRANSPORT TO  [facility] 

## 2024-06-24 NOTE — ED Notes (Signed)
 Pt ambulated to and from bathroom, no assistance required.

## 2024-06-24 NOTE — ED Notes (Addendum)
 Patient belongings sent with mother when patient initially presented to ED. No belongings present at the ED upon discharge to [facility].

## 2024-06-24 NOTE — ED Notes (Signed)
 Mother called and updated with patient permission- a letter for upcoming court date on 06/27/2024 provided for patient's mother to pick up at the front desk of emergency room. First nurse Morna aware.

## 2024-06-24 NOTE — ED Notes (Signed)
 Lunch was provided to pt.

## 2024-06-24 NOTE — ED Notes (Signed)
 Patient husband called to speak with patient after patient had left facility. This RN informed husband we could not confirm or deny any information to husband. Patient husband stated She already told me she was going to [facility], I'm about tired of her mother doing this. This RN informed husband that I could not share any medical information per patient request. Patient husband frustrated, but reported that's okay she'll tell me. This RN ended phone call.

## 2024-06-24 NOTE — ED Notes (Signed)
 This RN left voice mail for return call for [facility] for report, per instructions from [facility].

## 2024-06-24 NOTE — ED Notes (Signed)
 Breakfast was provided to, also items to take a shower was given to pt

## 2024-06-30 NOTE — ED Notes (Signed)
 Attempted to medicated pt per [facility]. That's not tylenol . RN showed pt packaging that states acetaminophen . Nope that's not tylenol . I don't want that.

## 2024-06-30 NOTE — ED Notes (Signed)
 Pt provided with meal tray.

## 2024-06-30 NOTE — ED Notes (Signed)
 Report given to [facility] at [facility].

## 2024-07-01 NOTE — ED Notes (Signed)
 Pt provided with verbal and written discharge instructions. Pt verbalizes understandings. All questions answered. Discharged with all belongings. Pt discharge back with [facility] staff and transported with [facility]

## 2024-08-08 ENCOUNTER — Emergency Department
Admission: EM | Admit: 2024-08-08 | Discharge: 2024-08-08 | Discharge status: Against Medical Advice | Payer: MEDICAID | Attending: Emergency Medicine | Admitting: Emergency Medicine

## 2024-08-08 DIAGNOSIS — Z5321 Procedure and treatment not carried out due to patient leaving prior to being seen by health care provider: Secondary | ICD-10-CM | POA: Insufficient documentation

## 2024-08-08 DIAGNOSIS — M549 Dorsalgia, unspecified: Secondary | ICD-10-CM | POA: Diagnosis not present

## 2024-08-08 DIAGNOSIS — O26892 Other specified pregnancy related conditions, second trimester: Secondary | ICD-10-CM | POA: Insufficient documentation

## 2024-08-08 DIAGNOSIS — Z3A18 18 weeks gestation of pregnancy: Secondary | ICD-10-CM | POA: Diagnosis not present

## 2024-08-08 NOTE — ED Triage Notes (Addendum)
 Patient wheeled to triage with complaints of back pain and being approx [redacted] weeks pregnant. Patient states he has hx of preeclampsia, follows with [facility]. While attempting to triage patient she states she just wants to leave. Refused VS. This RN encouraged to stay and be seen by provider for pregnancy and health. Refused and walked out in NAD with significant other.

## 2024-08-26 ENCOUNTER — Emergency Department
Admission: EM | Admit: 2024-08-26 | Discharge: 2024-08-26 | Discharge status: Against Medical Advice | Payer: MEDICAID | Attending: Emergency Medicine | Admitting: Emergency Medicine

## 2024-08-26 DIAGNOSIS — Z5321 Procedure and treatment not carried out due to patient leaving prior to being seen by health care provider: Secondary | ICD-10-CM | POA: Diagnosis not present

## 2024-08-26 DIAGNOSIS — J45909 Unspecified asthma, uncomplicated: Secondary | ICD-10-CM | POA: Diagnosis not present

## 2024-08-26 DIAGNOSIS — R0602 Shortness of breath: Secondary | ICD-10-CM | POA: Insufficient documentation

## 2024-08-26 NOTE — ED Notes (Signed)
 Pt seen walking out of the door prior to being triaged.

## 2024-08-26 NOTE — ED Notes (Signed)
 Pt decided to leave in the middle of triage. Pt reports she will come back in the morning, pt instructed if she feels worse throughout the night to come back before morning.

## 2024-08-26 NOTE — ED Triage Notes (Signed)
 Pt reports she has hx asthma and has had sob over 4-5 days, pt was seen at [facility] and left due to long wait. Pt speaking in complete sentences, NAD.

## 2024-08-27 ENCOUNTER — Inpatient Hospital Stay
Admission: EM | Admit: 2024-08-27 | Discharge: 2024-08-27 | Disposition: A | Discharge status: Home / Self Care | Payer: MEDICAID | Attending: Certified Nurse Midwife | Admitting: Certified Nurse Midwife

## 2024-08-27 DIAGNOSIS — N898 Other specified noninflammatory disorders of vagina: Secondary | ICD-10-CM | POA: Diagnosis not present

## 2024-08-27 DIAGNOSIS — O26892 Other specified pregnancy related conditions, second trimester: Secondary | ICD-10-CM | POA: Diagnosis present

## 2024-08-27 DIAGNOSIS — Z79899 Other long term (current) drug therapy: Secondary | ICD-10-CM | POA: Insufficient documentation

## 2024-08-27 DIAGNOSIS — O99891 Other specified diseases and conditions complicating pregnancy: Secondary | ICD-10-CM

## 2024-08-27 DIAGNOSIS — Z3A21 21 weeks gestation of pregnancy: Secondary | ICD-10-CM

## 2024-08-27 LAB — WET PREP, GENITAL
Clue Cells Wet Prep HPF POC: NONE SEEN
Sperm: NONE SEEN
Trich, Wet Prep: NONE SEEN
WBC, Wet Prep HPF POC: >=10 — AB (ref <10)
Yeast Wet Prep HPF POC: NONE SEEN

## 2024-08-27 LAB — RUPTURE OF MEMBRANE (ROM)PLUS: Rom Plus: NEGATIVE

## 2024-08-27 NOTE — OB Triage Provider Note (Cosign Needed Addendum)
 L&D OB Triage Note  SUBJECTIVE Eileen Moody is a 28 y.o. [id number] female at [redacted]w[redacted]d, EDD Estimated Date of Delivery: 01/06/25 who presented to triage with complaints of leaking of fluid. She is feeling fetal movement. She denies contractions and vaginal bleeding. She is currently getting prenatal care at the health department.   OB History  Gravida Para Term Preterm AB Living  5 3 3  0 1 3  SAB IAB Ectopic Multiple Live Births  1 0 0 0 3    # Outcome Date GA Lbr Len/2nd Weight Sex Type Anes PTL Lv  5 Current           4 Term 10/21/17 [redacted]w[redacted]d 07:40 / 00:23 3300 g M Vag-Spont EPI  LIV     Name: Moody,BOY Eileen     Apgar1: 8  Apgar5: 9  3 Term 2017 [redacted]w[redacted]d  3062 g  Vag-Spont  N LIV  2 SAB 2015 [redacted]w[redacted]d         1 Term 2014 [redacted]w[redacted]d  3118 g  Vag-Spont   LIV    Medications Prior to Admission  Medication Sig Dispense Refill Last Dose/Taking   QUEtiapine  (SEROQUEL ) 50 MG tablet Take 50 mg by mouth at bedtime.   Past Month   INVEGA  SUSTENNA 156 MG/ML SUSY injection Inject 156 mg into the muscle every 30 (thirty) days. (Patient not taking: Reported on 06/20/2024)   Not Taking   nicotine  (NICODERM CQ  - DOSED IN MG/24 HR) 7 mg/24hr patch Place 1 patch (7 mg total) onto the skin daily as needed (as needed). (Patient not taking: Reported on 06/20/2024) 28 patch 0 Not Taking   nicotine  polacrilex (NICORETTE ) 2 MG gum Take 1 each (2 mg total) by mouth as needed for smoking cessation. (Patient not taking: Reported on 06/20/2024) 100 tablet 0 Not Taking   paliperidone  (INVEGA ) 6 MG 24 hr tablet Take 1 tablet (6 mg total) by mouth daily. (Patient not taking: Reported on 06/20/2024)   Not Taking   traZODone  (DESYREL ) 50 MG tablet Take 1 tablet (50 mg total) by mouth at bedtime as needed for sleep. (Patient not taking: Reported on 06/20/2024)   Not Taking     OBJECTIVE  Nursing Evaluation:   BP 119/61   Pulse 98   Resp 18   Ht 5' 5 (1.651 m)   Wt 106.1 kg   LMP 05/01/2024 (Approximate)   SpO2 99%   BMI  38.94 kg/m    Findings:           Mode: Doppler Baseline Rate (A): 138 bpm  Ctx-none    Microscopic wet-mount exam shows negative for pathogens, normal epithelial cells .Wet prep, genital Order: [id number]  Status: Final result     Next appt: None   Test Result Released: No (scheduled for 08/27/2024  5:48 PM)   0 Result Notes    Component Ref Range & Units (hover) 16:02  Yeast Wet Prep HPF POC NONE SEEN  Trich, Wet Prep NONE SEEN  Clue Cells Wet Prep HPF POC NONE SEEN  WBC, Wet Prep HPF POC >=10 Abnormal   Sperm NONE SEEN  Comment: Performed at [facility], [address] [place], [place] [address]     Rupture of Membrane (ROM) Plus Order: [id number]  Status: Final result     Next appt: None   Test Result Released: No (scheduled for 08/27/2024  4:44 PM)   0 Result Notes    Component Ref Range & Units (hover) 14:28  Rom  Plus NEGATIVE  Comment: Performed at [facility], [address], [place], [place] [address]        ASSESSMENT Impression:  1.  Pregnancy:  H4E6986 at [redacted]w[redacted]d , EDD Estimated Date of Delivery: 01/06/25 2.  Reassuring fetal and maternal status 3.  Wet prep negative 4. ROM plus negative   PLAN 1. Current condition and above findings reviewed.  Reassuring fetal and maternal condition. Discussed increased vagial discharge in pregnancy. Warning signs and symptoms . Discussed possible leakage of urine . She verbalizes understanding.  2. Discharge home with standard labor precautions given to return to L&D or call the office for problems.  3. Continue routine prenatal care.    I was present and evaluated patient in person.   Zelda Hummer, CNM

## 2024-08-27 NOTE — OB Triage Note (Signed)
 Patient felt a gush of fluid after urinating and came in via EMS.

## 2024-08-27 NOTE — OB Triage Note (Signed)

## 2024-12-18 NOTE — L&D Delivery Note (Signed)
"     Delivery Note   Jessabelle Markiewicz is a 29 y.o. [id number] at [redacted]w[redacted]d Estimated Date of Delivery: 01/15/25  PRE-OPERATIVE DIAGNOSIS:  1) [redacted]w[redacted]d pregnancy.  2) GHTN 3) Schizophrenia, psychosis  POST-OPERATIVE DIAGNOSIS:  1) [redacted]w[redacted]d pregnancy s/p Vaginal, Spontaneous Same delivered  Delivery Type: Vaginal, Spontaneous   Delivery Anesthesia: Epidural  Labor Complications:  None    ESTIMATED BLOOD LOSS: 300 ml    FINDINGS:   Information for the patient's newborn:  Aniesha, Haughn [[id number]]  Live born female  Birth Weight: 5 lb 15.2 oz (2700 g) APGAR: 8, 9  Newborn Delivery   Birth date/time: [birth year]20[birth year] 20:11:00 Delivery type: Vaginal, Spontaneous      SPECIMENS:   PLACENTA:   Appearance: Intact   Removal: Spontaneous     Disposition: discarded  Cord Blood: not collected, mother of baby A POS  DISPOSITION:  Infant left in stable condition in the delivery room, with L&D personnel and mother,  NARRATIVE SUMMARY: Labor course:  Johnathon Mittal is a [id number] at [redacted]w[redacted]d who presented to Labor & Delivery for induction of labor. Her initial cervical exam was 3/60/-2. Labor proceeded with pitocin  & AROM and she was found to be completely dilated at 2008. With excellent maternal pushing effort, she birthed a viable female infant at 2011. Head birthed LOA, restituted to LOT. There was a nuchal cord x 1 and somersaulted and reduced after delivery. The shoulders were birthed without difficulty. The infant was placed skin-to-skin with mother. The cord was doubly clamped and cut when pulsations ceased. The placenta delivered spontaneously and was noted to be intact with a 3VC. A perineal and vaginal examination was performed. Episiotomy/Lacerations: None Tolerated well. Mother and baby left in stable condition.   Harlene LITTIE Cisco, CNM 01/02/2025 8:52 PM "

## 2024-12-19 ENCOUNTER — Emergency Department
Admission: EM | Admit: 2024-12-19 | Discharge: 2024-12-20 | Disposition: A | Discharge status: Other Acute Inpatient Hospital | Payer: MEDICAID | Source: Home / Self Care | Attending: Emergency Medicine | Admitting: Emergency Medicine

## 2024-12-19 ENCOUNTER — Encounter: Payer: Self-pay | Admitting: Emergency Medicine

## 2024-12-19 ENCOUNTER — Other Ambulatory Visit: Payer: Self-pay

## 2024-12-19 DIAGNOSIS — O99343 Other mental disorders complicating pregnancy, third trimester: Secondary | ICD-10-CM | POA: Diagnosis not present

## 2024-12-19 DIAGNOSIS — Z3A00 Weeks of gestation of pregnancy not specified: Secondary | ICD-10-CM | POA: Insufficient documentation

## 2024-12-19 DIAGNOSIS — F29 Unspecified psychosis not due to a substance or known physiological condition: Secondary | ICD-10-CM | POA: Insufficient documentation

## 2024-12-19 DIAGNOSIS — F209 Schizophrenia, unspecified: Secondary | ICD-10-CM | POA: Diagnosis not present

## 2024-12-19 DIAGNOSIS — O26893 Other specified pregnancy related conditions, third trimester: Secondary | ICD-10-CM | POA: Diagnosis present

## 2024-12-19 NOTE — ED Notes (Signed)
 Pt to interview room with TTS for pt evaluation

## 2024-12-19 NOTE — ED Provider Notes (Signed)
 "  [facility] Provider Note    Event Date/Time   First MD Initiated Contact with Patient 12/19/24 2130     (approximate)   History   Insect Bite   HPI  Eileen Moody is a 29 y.o. female who per chart review has history of schizophrenia and bipolar who presents to the emergency department today because of concerns for spider bite.  States that she was bit on her right thumb.  She is concerned because she is in her third trimester pregnancy.  H&P is limited by patient's unwillingness to cooperate with exam or history taking.     Physical Exam   Triage Vital Signs: ED Triage Vitals  Encounter Vitals Group     BP 12/19/24 2137 (!) 124/106     Girls Systolic BP Percentile --      Girls Diastolic BP Percentile --      Boys Systolic BP Percentile --      Boys Diastolic BP Percentile --      Pulse Rate 12/19/24 2137 (!) 112     Resp 12/19/24 2137 19     Temp 12/19/24 2137 98.1 F (36.7 C)     Temp Source 12/19/24 2137 Oral     SpO2 12/19/24 2137 91 %     Weight 12/19/24 2138 165 lb (74.8 kg)     Height 12/19/24 2138 5' 5 (1.651 m)     Head Circumference --      Peak Flow --      Pain Score 12/19/24 2138 7     Pain Loc --      Pain Education --      Exclude from Growth Chart --     Most recent vital signs: Vitals:   12/19/24 2137  BP: (!) 124/106  Pulse: (!) 112  Resp: 19  Temp: 98.1 F (36.7 C)  SpO2: 91%   General: Awake, alert. CV:  Good peripheral perfusion.  Resp:  Normal effort.  Abd:  No distention.  Ext:  Right thumb without any erythema, swelling. Psych:  Paranoia.  Disorganized thoughts.  Slightly pressured speech.   ED Results / Procedures / Treatments   Labs (all labs ordered are listed, but only abnormal results are displayed) Labs Reviewed - No data to display   EKG  None   RADIOLOGY None   PROCEDURES:  Critical Care performed: No    MEDICATIONS ORDERED IN ED: Medications - No data to  display   IMPRESSION / MDM / ASSESSMENT AND PLAN / ED COURSE  I reviewed the triage vital signs and the nursing notes.                              Differential diagnosis includes, but is not limited to, psychosis, drug induced mood disorder  Patient's presentation is most consistent with acute presentation with potential threat to life or bodily function.  Patient presents to the emergency department today because of concerns for a spider bite.  On exam no evidence of any concerning infection or reaction on her thumb.  However she is significantly paranoid.  She will not let me touch her thumb.  She has disorganized thoughts.  She does have history of psychiatric illness.  Given concern for patient's psychosis will place patient under IVC.  Will have psychiatry evaluate.  The patient has been placed in psychiatric observation due to the need to provide a safe environment for the patient while  obtaining psychiatric consultation and evaluation, as well as ongoing medical and medication management to treat the patient's condition.  The patient has been placed under full IVC at this time.       FINAL CLINICAL IMPRESSION(S) / ED DIAGNOSES   Final diagnoses:  Psychosis, unspecified psychosis type (HCC)     Note:  This document was prepared using Dragon voice recognition software and may include unintentional dictation errors.    Floy Roberts, MD 12/19/24 2358  "
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
## 2024-12-19 NOTE — ED Triage Notes (Addendum)
 Pt arrived to ED via EMS from home with c/o possible spider bite and pain to her right thumb from earlier today. Pt states she identified the wolf spider when it bit her. No obvious redness/wound/swelling/drainage seen at this time. Pt has hx of schizophrenia and EMS / [place] [employer] concerned pt his having a mental health issue. When asked if she was experiencing any symptoms other than pain pt pointed to her feet and said swelling and that she had to protect her baby. Pt states she was raised by a native american and has to sit up to protect her baby. When this nurse introduced herself pt states she doesn't have a nurse she didn't sign any papers to have a nurse. Pt currently pregnant and states the baby will be delivered on Jan 27 and that this is her 4th pregnancy. Pt states she goes to [facility] for her prenatal care. Pt guarded and hostile and seems to be reluctant to answer questions.

## 2024-12-20 ENCOUNTER — Other Ambulatory Visit: Payer: Self-pay

## 2024-12-20 ENCOUNTER — Encounter: Payer: Self-pay | Admitting: Psychiatry

## 2024-12-20 ENCOUNTER — Inpatient Hospital Stay
Admission: AD | Admit: 2024-12-20 | Discharge: 2025-01-02 | DRG: 832 | Disposition: A | Discharge status: Still In Hospital | Payer: MEDICAID | Attending: Psychiatry | Admitting: Psychiatry

## 2024-12-20 DIAGNOSIS — O99343 Other mental disorders complicating pregnancy, third trimester: Principal | ICD-10-CM | POA: Diagnosis present

## 2024-12-20 DIAGNOSIS — F17210 Nicotine dependence, cigarettes, uncomplicated: Secondary | ICD-10-CM | POA: Diagnosis present

## 2024-12-20 DIAGNOSIS — Z79899 Other long term (current) drug therapy: Secondary | ICD-10-CM

## 2024-12-20 DIAGNOSIS — F200 Paranoid schizophrenia: Principal | ICD-10-CM | POA: Diagnosis present

## 2024-12-20 DIAGNOSIS — O99344 Other mental disorders complicating childbirth: Secondary | ICD-10-CM | POA: Diagnosis present

## 2024-12-20 DIAGNOSIS — Z3A37 37 weeks gestation of pregnancy: Secondary | ICD-10-CM | POA: Diagnosis not present

## 2024-12-20 DIAGNOSIS — O1493 Unspecified pre-eclampsia, third trimester: Secondary | ICD-10-CM | POA: Diagnosis not present

## 2024-12-20 DIAGNOSIS — O99333 Smoking (tobacco) complicating pregnancy, third trimester: Secondary | ICD-10-CM | POA: Diagnosis present

## 2024-12-20 DIAGNOSIS — O134 Gestational [pregnancy-induced] hypertension without significant proteinuria, complicating childbirth: Secondary | ICD-10-CM | POA: Diagnosis present

## 2024-12-20 LAB — CBC WITH DIFFERENTIAL/PLATELET
Abs Immature Granulocytes: 0.07 K/uL (ref 0.00–0.07)
Basophils Absolute: 0.1 K/uL (ref 0.0–0.1)
Basophils Relative: 0 %
Eosinophils Absolute: 0.1 K/uL (ref 0.0–0.5)
Eosinophils Relative: 1 %
HCT: 42.4 % (ref 36.0–46.0)
Hemoglobin: 14.5 g/dL (ref 12.0–15.0)
Immature Granulocytes: 1 %
Lymphocytes Relative: 21 %
Lymphs Abs: 3.2 K/uL (ref 0.7–4.0)
MCH: 30.9 pg (ref 26.0–34.0)
MCHC: 34.2 g/dL (ref 30.0–36.0)
MCV: 90.2 fL (ref 80.0–100.0)
Monocytes Absolute: 1.1 K/uL — ABNORMAL HIGH (ref 0.1–1.0)
Monocytes Relative: 7 %
Neutro Abs: 10.8 K/uL — ABNORMAL HIGH (ref 1.7–7.7)
Neutrophils Relative %: 70 %
Platelets: 274 K/uL (ref 150–400)
RBC: 4.7 MIL/uL (ref 3.87–5.11)
RDW: 12.9 % (ref 11.5–15.5)
WBC: 15.3 K/uL — ABNORMAL HIGH (ref 4.0–10.5)
nRBC: 0 % (ref 0.0–0.2)

## 2024-12-20 LAB — BASIC METABOLIC PANEL WITH GFR
Anion gap: 15 (ref 5–15)
BUN: 9 mg/dL (ref 6–20)
CO2: 17 mmol/L — ABNORMAL LOW (ref 22–32)
Calcium: 9.4 mg/dL (ref 8.9–10.3)
Chloride: 103 mmol/L (ref 98–111)
Creatinine, Ser: 0.9 mg/dL (ref 0.44–1.00)
GFR, Estimated: >60 mL/min
Glucose, Bld: 90 mg/dL (ref 70–99)
Potassium: 3.5 mmol/L (ref 3.5–5.1)
Sodium: 135 mmol/L (ref 135–145)

## 2024-12-20 LAB — URINALYSIS, ROUTINE W REFLEX MICROSCOPIC
Bilirubin Urine: NEGATIVE
Glucose, UA: NEGATIVE mg/dL
Hgb urine dipstick: NEGATIVE
Ketones, ur: NEGATIVE mg/dL
Nitrite: NEGATIVE
Protein, ur: 100 mg/dL — AB
Specific Gravity, Urine: 1.032 — ABNORMAL HIGH (ref 1.005–1.030)
WBC, UA: >50 WBC/hpf (ref 0–5)
pH: 5 (ref 5.0–8.0)

## 2024-12-20 LAB — URINE DRUG SCREEN
Amphetamines: NEGATIVE
Barbiturates: NEGATIVE
Benzodiazepines: NEGATIVE
Cocaine: NEGATIVE
Fentanyl: NEGATIVE
Methadone Scn, Ur: NEGATIVE
Opiates: NEGATIVE
Tetrahydrocannabinol: NEGATIVE

## 2024-12-20 LAB — ACETAMINOPHEN LEVEL: Acetaminophen (Tylenol), Serum: <10 ug/mL — ABNORMAL LOW (ref 10–30)

## 2024-12-20 LAB — SALICYLATE LEVEL: Salicylate Lvl: <7 mg/dL — ABNORMAL LOW (ref 7.0–30.0)

## 2024-12-20 MED ORDER — LORAZEPAM 2 MG/ML IJ SOLN: 2.0000 mg | Freq: Three times a day (TID) | INTRAMUSCULAR | Status: DC | PRN

## 2024-12-20 MED ORDER — DIPHENHYDRAMINE HCL 50 MG/ML IJ SOLN: 50.0000 mg | Freq: Three times a day (TID) | INTRAMUSCULAR | Status: DC | PRN

## 2024-12-20 MED ORDER — NICOTINE 21 MG/24HR TD PT24
21.0000 mg | MEDICATED_PATCH | Freq: Every day | TRANSDERMAL | Status: DC
  Administered 2024-12-20 – 2024-12-30 (×8): 21 mg via TRANSDERMAL
  Filled 2024-12-20 (×11): qty 1

## 2024-12-20 MED ORDER — ACETAMINOPHEN 325 MG PO TABS
650.0000 mg | ORAL_TABLET | Freq: Four times a day (QID) | ORAL | Status: DC | PRN
  Administered 2024-12-31 – 2025-01-02 (×2): 650 mg via ORAL
  Filled 2024-12-20 (×2): qty 2

## 2024-12-20 MED ORDER — HALOPERIDOL LACTATE 5 MG/ML IJ SOLN
10.0000 mg | Freq: Three times a day (TID) | INTRAMUSCULAR | Status: DC | PRN
  Administered 2024-12-30 – 2025-01-02 (×2): 10 mg via INTRAMUSCULAR
  Filled 2024-12-20 (×3): qty 2

## 2024-12-20 MED ORDER — HALOPERIDOL 5 MG PO TABS
5.0000 mg | ORAL_TABLET | Freq: Three times a day (TID) | ORAL | Status: DC | PRN
  Administered 2024-12-22 – 2024-12-26 (×3): 5 mg via ORAL
  Filled 2024-12-20 (×5): qty 1

## 2024-12-20 MED ORDER — HALOPERIDOL LACTATE 5 MG/ML IJ SOLN
5.0000 mg | Freq: Three times a day (TID) | INTRAMUSCULAR | Status: DC | PRN
  Administered 2024-12-21 – 2024-12-23 (×2): 5 mg via INTRAMUSCULAR
  Filled 2024-12-20 (×2): qty 1

## 2024-12-20 MED ORDER — PRENATAL MULTIVITAMIN CH
1.0000 | ORAL_TABLET | Freq: Every day | ORAL | Status: DC
  Filled 2024-12-20: qty 1

## 2024-12-20 MED ORDER — DIPHENHYDRAMINE HCL 25 MG PO CAPS: 50.0000 mg | ORAL_CAPSULE | Freq: Three times a day (TID) | ORAL | Status: DC | PRN

## 2024-12-20 MED ORDER — ALUM & MAG HYDROXIDE-SIMETH 200-200-20 MG/5ML PO SUSP: 30.0000 mL | ORAL | Status: DC | PRN

## 2024-12-20 MED ORDER — QUETIAPINE FUMARATE 25 MG PO TABS
50.0000 mg | ORAL_TABLET | Freq: Every day | ORAL | Status: DC
  Administered 2024-12-20 – 2024-12-22 (×3): 50 mg via ORAL
  Filled 2024-12-20 (×3): qty 2

## 2024-12-20 MED ORDER — MAGNESIUM HYDROXIDE 400 MG/5ML PO SUSP: 30.0000 mL | Freq: Every day | ORAL | Status: DC | PRN

## 2024-12-20 NOTE — Tx Team (Signed)
 Initial Treatment Plan 12/20/2024 2:46 AM [name] FMW:[id number]    PATIENT STRESSORS: Marital or family conflict   Medication change or noncompliance     PATIENT STRENGTHS: Motivation for treatment/growth  Supportive family/friends    PATIENT IDENTIFIED PROBLEMS: Acute Psychosis  Anxiety                   DISCHARGE CRITERIA:  Improved stabilization in mood, thinking, and/or behavior Verbal commitment to aftercare and medication compliance  PRELIMINARY DISCHARGE PLAN: Outpatient therapy Return to previous living arrangement  PATIENT/FAMILY INVOLVEMENT: This treatment plan has been presented to and reviewed with the patient, Eileen Moody. The patient has been given the opportunity to ask questions and make suggestions.  Donnice ONEIDA Chess, RN 12/20/2024, 2:46 AM

## 2024-12-20 NOTE — H&P (Signed)
 " Psychiatric Admission Assessment Adult  Patient Identification: Eileen Moody MRN:  [id number] Date of Evaluation:  12/20/2024 Chief Complaint:  Schizophrenia, paranoid (HCC) [F20.0]   History of Present Illness: ***  Total Time spent with patient: {Time; 15 min - 8 hours:17441} Sleep  Sleep:Sleep: -- (unable to assess)  Past Psychiatric History: *** Psychiatric History:  Information collected from ***  Prev Dx/Sx: *** Current Psych Provider: *** Home Meds (current): *** Previous Med Trials: *** Therapy: ***  Prior Psych Hospitalization: ***  Prior Self Harm: *** Prior Violence: ***  Family Psych History: *** Family Hx suicide: ***  Social History:  Developmental Hx: *** Educational Hx: *** Occupational Hx: *** Legal Hx: *** Living Situation: *** Spiritual Hx: *** Access to weapons/lethal means: ***   Substance History Alcohol: ***  Type of alcohol *** Last Drink *** Number of drinks per day *** History of alcohol withdrawal seizures *** History of DT's *** Tobacco: *** Illicit drugs: *** Prescription drug abuse: *** Rehab hx: *** Is the patient at risk to self? {yes no:314532}  Has the patient been a risk to self in the past 6 months? {yes no:314532}  Has the patient been a risk to self within the distant past? {yes no:314532}  Is the patient a risk to others? {yes no:314532}  Has the patient been a risk to others in the past 6 months? {yes no:314532}  Has the patient been a risk to others within the distant past? {yes no:314532}   Columbia Scale:  Flowsheet Row Admission (Current) from 12/20/2024 in [facility] INPATIENT BEHAVIORAL MEDICINE ED from 12/19/2024 in [facility] Emergency Department at [facility] ED from 06/20/2024 in [facility] Emergency Department at [facility]  C-SSRS RISK CATEGORY No Risk No Risk No Risk     Past Medical History:  Past Medical History:  Diagnosis Date   Anemia    History of anemia    during pregnancy    Schizophrenia (HCC)    STD (sexually transmitted disease) 2015   H/o gonorrhea/chlamydia.  Treated    Past Surgical History:  Procedure Laterality Date   NO PAST SURGERIES     Family History:  Family History  Problem Relation Age of Onset   Diabetes Neg Hx    Hypertension Neg Hx    Cancer Neg Hx     Social History:  Social History   Substance and Sexual Activity  Alcohol Use No     Social History   Substance and Sexual Activity  Drug Use No      Allergies:  Allergies[1] Lab Results:  Results for orders placed or performed during the hospital encounter of 12/19/24 (from the past 48 hours)  Urinalysis, Routine w reflex microscopic -Urine, Clean Catch     Status: Abnormal   Collection Time: 12/19/24 11:57 PM  Result Value Ref Range   Color, Urine AMBER (A) YELLOW    Comment: BIOCHEMICALS Eileen Moody BE AFFECTED BY COLOR   APPearance CLOUDY (A) CLEAR   Specific Gravity, Urine 1.032 (H) 1.005 - 1.030   pH 5.0 5.0 - 8.0   Glucose, UA NEGATIVE NEGATIVE mg/dL   Hgb urine dipstick NEGATIVE NEGATIVE   Bilirubin Urine NEGATIVE NEGATIVE   Ketones, ur NEGATIVE NEGATIVE mg/dL   Protein, ur 899 (A) NEGATIVE mg/dL   Nitrite NEGATIVE NEGATIVE   Leukocytes,Ua MODERATE (A) NEGATIVE   RBC / HPF 21-50 0 - 5 RBC/hpf   WBC, UA >50 0 - 5 WBC/hpf   Bacteria, UA FEW (A) NONE SEEN  Squamous Epithelial / HPF 21-50 0 - 5 /HPF   Mucus PRESENT    Non Squamous Epithelial PRESENT (A) NONE SEEN    Comment: Performed at [facility], [address] [place], [place] [address]  Urine Drug Screen     Status: None   Collection Time: 12/19/24 11:57 PM  Result Value Ref Range   Opiates NEGATIVE NEGATIVE   Cocaine NEGATIVE NEGATIVE   Benzodiazepines NEGATIVE NEGATIVE   Amphetamines NEGATIVE NEGATIVE   Tetrahydrocannabinol NEGATIVE NEGATIVE   Barbiturates NEGATIVE NEGATIVE   Methadone Scn, Ur NEGATIVE NEGATIVE   Fentanyl  NEGATIVE NEGATIVE    Comment: (NOTE) Drug screen is for Medical  Purposes only. Positive results are preliminary only. If confirmation is needed, notify lab within 5 days.  Drug Class                 Cutoff (ng/mL) Amphetamine and metabolites 1000 Barbiturate and metabolites 200 Benzodiazepine              200 Opiates and metabolites     300 Cocaine and metabolites     300 THC                         50 Fentanyl                     5 Methadone                   300  Trazodone  is metabolized in vivo to several metabolites,  including pharmacologically active m-CPP, which is excreted in the  urine.  Immunoassay screens for amphetamines and MDMA have potential  cross-reactivity with these compounds and Eileen Moody provide false positive  result.  Performed at [facility], [address] [place], [place] [address]   CBC with Differential     Status: Abnormal   Collection Time: 12/20/24 12:18 AM  Result Value Ref Range   WBC 15.3 (H) 4.0 - 10.5 K/uL   RBC 4.70 3.87 - 5.11 MIL/uL   Hemoglobin 14.5 12.0 - 15.0 g/dL   HCT 57.5 63.9 - 53.9 %   MCV 90.2 80.0 - 100.0 fL   MCH 30.9 26.0 - 34.0 pg   MCHC 34.2 30.0 - 36.0 g/dL   RDW 87.0 88.4 - 84.4 %   Platelets 274 150 - 400 K/uL   nRBC 0.0 0.0 - 0.2 %   Neutrophils Relative % 70 %   Neutro Abs 10.8 (H) 1.7 - 7.7 K/uL   Lymphocytes Relative 21 %   Lymphs Abs 3.2 0.7 - 4.0 K/uL   Monocytes Relative 7 %   Monocytes Absolute 1.1 (H) 0.1 - 1.0 K/uL   Eosinophils Relative 1 %   Eosinophils Absolute 0.1 0.0 - 0.5 K/uL   Basophils Relative 0 %   Basophils Absolute 0.1 0.0 - 0.1 K/uL   Immature Granulocytes 1 %   Abs Immature Granulocytes 0.07 0.00 - 0.07 K/uL    Comment: Performed at [facility], [address], [place], [place] [address]  Basic metabolic panel     Status: Abnormal   Collection Time: 12/20/24 12:18 AM  Result Value Ref Range   Sodium 135 135 - 145 mmol/L   Potassium 3.5 3.5 - 5.1 mmol/L   Chloride 103 98 - 111 mmol/L   CO2 17 (L) 22 - 32 mmol/L   Glucose, Bld 90  70 - 99 mg/dL    Comment: Glucose reference range applies only  to samples taken after fasting for at least 8 hours.   BUN 9 6 - 20 mg/dL   Creatinine, Ser 9.09 0.44 - 1.00 mg/dL   Calcium 9.4 8.9 - 89.6 mg/dL   GFR, Estimated >39 >39 mL/min    Comment: (NOTE) Calculated using the CKD-EPI Creatinine Equation (2021)    Anion gap 15 5 - 15    Comment: Performed at [facility], [address] [place] [address] [place], [place] [address]  Salicylate level     Status: Abnormal   Collection Time: 12/20/24 12:18 AM  Result Value Ref Range   Salicylate Lvl <7.0 (L) 7.0 - 30.0 mg/dL    Comment: Performed at [facility], [address] [place], [place] [address]  Acetaminophen  level     Status: Abnormal   Collection Time: 12/20/24 12:18 AM  Result Value Ref Range   Acetaminophen  (Tylenol ), Serum <10 (L) 10 - 30 ug/mL    Comment: (NOTE) Toxic concentrations can be more effectively related to post dose interval; >200, >100, and >50 ug/mL serum concentrations correspond to toxic concentrations at 4, 8, and 12 hours post dose, respectively.  Performed at [facility], [address] [place], [place] [address]     Blood Alcohol level:  Lab Results  Component Value Date   [facility] <15 06/20/2024   ETH <10 05/23/2022    Metabolic Disorder Labs:  Lab Results  Component Value Date   HGBA1C 4.7 (L) 05/23/2022   MPG 88.19 05/23/2022   No results found for: PROLACTIN Lab Results  Component Value Date   CHOL 149 05/23/2022   TRIG 31 05/23/2022   HDL 60 05/23/2022   CHOLHDL 2.5 05/23/2022   VLDL 6 05/23/2022   LDLCALC 83 05/23/2022    Current Medications: Current Facility-Administered Medications  Medication Dose Route Frequency Provider Last Rate Last Admin   acetaminophen  (TYLENOL ) tablet 650 mg  650 mg Oral Q6H PRN McLauchlin, Angela, NP       alum & mag hydroxide-simeth (MAALOX/MYLANTA) 200-200-20 MG/5ML suspension 30 mL  30 mL Oral Q4H PRN McLauchlin, Angela, NP        haloperidol  (HALDOL ) tablet 5 mg  5 mg Oral TID PRN McLauchlin, Angela, NP       And   diphenhydrAMINE  (BENADRYL ) capsule 50 mg  50 mg Oral TID PRN McLauchlin, Angela, NP       haloperidol  lactate (HALDOL ) injection 5 mg  5 mg Intramuscular TID PRN McLauchlin, Angela, NP       And   diphenhydrAMINE  (BENADRYL ) injection 50 mg  50 mg Intramuscular TID PRN McLauchlin, Angela, NP       And   LORazepam  (ATIVAN ) injection 2 mg  2 mg Intramuscular TID PRN McLauchlin, Angela, NP       haloperidol  lactate (HALDOL ) injection 10 mg  10 mg Intramuscular TID PRN McLauchlin, Angela, NP       And   diphenhydrAMINE  (BENADRYL ) injection 50 mg  50 mg Intramuscular TID PRN McLauchlin, Angela, NP       And   LORazepam  (ATIVAN ) injection 2 mg  2 mg Intramuscular TID PRN McLauchlin, Angela, NP       magnesium  hydroxide (MILK OF MAGNESIA) suspension 30 mL  30 mL Oral Daily PRN McLauchlin, Angela, NP       nicotine  (NICODERM CQ  - dosed in mg/24 hours) patch 21 mg  21 mg Transdermal Q0600 McLauchlin, Jon, NP   21 mg at 12/20/24 9170   PTA Medications: Medications Prior to Admission  Medication  Sig Dispense Refill Last Dose/Taking   INVEGA  SUSTENNA 156 MG/ML SUSY injection Inject 156 mg into the muscle every 30 (thirty) days. (Patient not taking: Reported on 06/20/2024)      nicotine  (NICODERM CQ  - DOSED IN MG/24 HR) 7 mg/24hr patch Place 1 patch (7 mg total) onto the skin daily as needed (as needed). (Patient not taking: Reported on 06/20/2024) 28 patch 0    nicotine  polacrilex (NICORETTE ) 2 MG gum Take 1 each (2 mg total) by mouth as needed for smoking cessation. (Patient not taking: Reported on 06/20/2024) 100 tablet 0    paliperidone  (INVEGA ) 6 MG 24 hr tablet Take 1 tablet (6 mg total) by mouth daily. (Patient not taking: Reported on 06/20/2024)      QUEtiapine  (SEROQUEL ) 50 MG tablet Take 50 mg by mouth at bedtime.       Psychiatric Specialty Exam:  Presentation  General Appearance:  Disheveled  Eye  Contact: Minimal  Speech: Clear and Coherent; Normal Rate  Speech Volume: Normal    Mood and Affect  Mood: Irritable  Affect: Congruent   Thought Process  Thought Processes: Other (comment) (unable to assess)  Descriptions of Associations:-- (unable to assess)  Orientation:Other (comment) (unable to assess)  Thought Content:Other (comment) (unable to assess)  Hallucinations:Hallucinations: Other (comment) (unable to assess)  Ideas of Reference:Other (comment) (unable to assess)  Suicidal Thoughts:Suicidal Thoughts: -- (unable to assess)  Homicidal Thoughts:Homicidal Thoughts: -- (unable to assess)   Sensorium  Memory: Other (comment) (unable to assess)  Judgment: Impaired  Insight: Lacking   Executive Functions  Concentration: Other (comment) (unable to assess)  Attention Span: Other (comment) (unable to assess)  Recall: Other (comment) (unable to assess)  Fund of Knowledge: Other (comment) (unable to assess)  Language: Fair   Psychomotor Activity  Psychomotor Activity: Psychomotor Activity: Normal   Assets  Assets: Social Support; Housing    Musculoskeletal: Strength & Muscle Tone: {desc; muscle tone:32375} Gait & Station: {PE GAIT ED WJUO:77474}  Physical Exam: Physical Exam ROS Blood pressure 118/82, pulse (!) 122, temperature 98.5 F (36.9 C), temperature source Oral, resp. rate 17, height 5' 5 (1.651 m), weight 74.8 kg, last menstrual period 05/01/2024, SpO2 98%, unknown if currently breastfeeding. Body mass index is 27.46 kg/m.  Principal Diagnosis: Schizophrenia, paranoid (HCC) Diagnosis:  Principal Problem:   Schizophrenia, paranoid (HCC)   Clinical Decision Making:  Treatment Plan Summary:  Safety and Monitoring:             -- Voluntary admission to inpatient psychiatric unit for safety, stabilization and treatment             -- Daily contact with patient to assess and evaluate symptoms and progress in  treatment             -- Patient's case to be discussed in multi-disciplinary team meeting             -- Observation Level: q15 minute checks             -- Vital signs:  q12 hours             -- Precautions: suicide, elopement, and assault   2. Psychiatric Diagnoses and Treatment:               -- The risks/benefits/side-effects/alternatives to this medication were discussed in detail with the patient and time was given for questions. The patient consents to medication trial.                --  Metabolic profile and EKG monitoring obtained while on an atypical antipsychotic (BMI: Lipid Panel: HbgA1c: QTc:)              -- Encouraged patient to participate in unit milieu and in scheduled group therapies                            3. Medical Issues Being Addressed:  Consult to OBGYN inpatient due to current pregnancy - EDD from chart review; 01/13/2025   4. Discharge Planning:              -- Social work and case management to assist with discharge planning and identification of hospital follow-up needs prior to discharge             -- Estimated LOS: 5-7 days             -- Discharge Concerns: Need to establish a safety plan; Medication compliance and effectiveness             -- Discharge Goals: Return home with outpatient referrals follow ups  Physician Treatment Plan for Primary Diagnosis: Schizophrenia, paranoid (HCC) Long Term Goal(s): Improvement in symptoms so as ready for discharge  Short Term Goals: Ability to verbalize feelings will improve, Ability to maintain clinical measurements within normal limits will improve, Compliance with prescribed medications will improve, and Ability to identify triggers associated with substance abuse/mental health issues will improve   I certify that inpatient services furnished can reasonably be expected to improve the patient's condition.    Redell Nazir, NP 1/3/20261:02 PM      [1] No Known Allergies  "

## 2024-12-20 NOTE — BHH Suicide Risk Assessment (Signed)
 [facility] Admission Suicide Risk Assessment   Nursing information obtained from:  Patient Demographic factors:  NA Current Mental Status:  NA Loss Factors:  NA Historical Factors:  NA Risk Reduction Factors:  NA  Total Time spent with patient: 15 minutes Principal Problem: Schizophrenia, paranoid (HCC) Diagnosis:  Principal Problem:   Schizophrenia, paranoid (HCC)  Subjective Data: ***  Continued Clinical Symptoms:  Alcohol Use Disorder Identification Test Final Score (AUDIT): 0 The Alcohol Use Disorders Identification Test, Guidelines for Use in Primary Care, Second Edition.  World Science Writer [facility]). Score between 0-7:  no or low risk or alcohol related problems. Score between 8-15:  moderate risk of alcohol related problems. Score between 16-19:  high risk of alcohol related problems. Score 20 or above:  warrants further diagnostic evaluation for alcohol dependence and treatment.   CLINICAL FACTORS:   Schizophrenia:   Less than 65 years old Currently Psychotic Previous Psychiatric Diagnoses and Treatments Medical Diagnoses and Treatments/Surgeries   Musculoskeletal: Strength & Muscle Tone: within normal limits Gait & Station: normal Patient leans: N/A  Psychiatric Specialty Exam:  Presentation  General Appearance:  Disheveled  Eye Contact: Minimal  Speech: Clear and Coherent; Normal Rate  Speech Volume: Normal  Handedness:No data recorded  Mood and Affect  Mood: Irritable  Affect: Congruent   Thought Process  Thought Processes: Other (comment) (unable to assess)  Descriptions of Associations:-- (unable to assess)  Orientation:Other (comment) (unable to assess)  Thought Content:Other (comment) (unable to assess)  History of Schizophrenia/Schizoaffective disorder:Yes  Duration of Psychotic Symptoms:Greater than six months  Hallucinations:Hallucinations: Other (comment) (unable to assess)  Ideas of Reference:Other (comment) (unable to  assess)  Suicidal Thoughts:Suicidal Thoughts: -- (unable to assess)  Homicidal Thoughts:Homicidal Thoughts: -- (unable to assess)   Sensorium  Memory: Other (comment) (unable to assess)  Judgment: Impaired  Insight: Lacking   Executive Functions  Concentration: Other (comment) (unable to assess)  Attention Span: Other (comment) (unable to assess)  Recall: Other (comment) (unable to assess)  Fund of Knowledge: Other (comment) (unable to assess)  Language: Fair   Psychomotor Activity  Psychomotor Activity: Psychomotor Activity: Normal   Assets  Assets: Social Support; Housing   Sleep  Sleep: Sleep: -- (unable to assess)    Physical Exam: Physical Exam ROS Blood pressure 118/82, pulse (!) 122, temperature 98.5 F (36.9 C), temperature source Oral, resp. rate 17, height 5' 5 (1.651 m), weight 74.8 kg, last menstrual period 05/01/2024, SpO2 98%, unknown if currently breastfeeding. Body mass index is 27.46 kg/m.   COGNITIVE FEATURES THAT CONTRIBUTE TO RISK:  N/a Unable to assess    SUICIDE RISK:   Mild:  Suicidal ideation of limited frequency, intensity, duration, and specificity.  There are no identifiable plans, no associated intent, mild dysphoria and related symptoms, good self-control (both objective and subjective assessment), few other risk factors, and identifiable protective factors, including available and accessible social support.  PLAN OF CARE: Inpatient psychiatric admission for stabilization  I certify that inpatient services furnished can reasonably be expected to improve the patient's condition.   Kaiel Weide, NP 12/20/2024, 1:00 PM

## 2024-12-20 NOTE — Plan of Care (Signed)
 Pt new to the unit tonight, hasn't had time to progress  Problem: Education: Goal: Knowledge of [place] [facility] Education information/materials will improve Outcome: Not Progressing Goal: Emotional status will improve Outcome: Not Progressing Goal: Mental status will improve Outcome: Not Progressing Goal: Verbalization of understanding the information provided will improve Outcome: Not Progressing   Problem: Activity: Goal: Interest or engagement in activities will improve Outcome: Not Progressing Goal: Sleeping patterns will improve Outcome: Not Progressing   Problem: Coping: Goal: Ability to verbalize frustrations and anger appropriately will improve Outcome: Not Progressing Goal: Ability to demonstrate self-control will improve Outcome: Not Progressing   Problem: Health Behavior/Discharge Planning: Goal: Identification of resources available to assist in meeting health care needs will improve Outcome: Not Progressing Goal: Compliance with treatment plan for underlying cause of condition will improve Outcome: Not Progressing   Problem: Physical Regulation: Goal: Ability to maintain clinical measurements within normal limits will improve Outcome: Not Progressing   Problem: Safety: Goal: Periods of time without injury will increase Outcome: Not Progressing

## 2024-12-20 NOTE — Plan of Care (Signed)
" °  Problem: Education: Goal: Emotional status will improve 12/20/2024 1755 by Zachary Titus BIRCH, RN Outcome: Progressing 12/20/2024 1754 by Zachary Titus BIRCH, RN Outcome: Progressing Goal: Mental status will improve 12/20/2024 1755 by Zachary Titus BIRCH, RN Outcome: Progressing 12/20/2024 1754 by Zachary Titus BIRCH, RN Outcome: Progressing   "

## 2024-12-20 NOTE — Progress Notes (Signed)
" °   12/20/24 0830  Psych Admission Type (Psych Patients Only)  Admission Status Involuntary  Psychosocial Assessment  Patient Complaints Anxiety;Irritability  Eye Contact Brief  Facial Expression Animated  Affect Anxious;Apprehensive  Speech Soft  Interaction Cautious;Guarded;Defensive  Motor Activity Slow  Appearance/Hygiene Unremarkable  Behavior Characteristics Appropriate to situation  Mood Anxious;Irritable  Thought Process  Coherency Disorganized  Content Preoccupation  Delusions None reported or observed  Perception WDL  Hallucination None reported or observed  Judgment WDL  Confusion Mild  Danger to Self  Current suicidal ideation? Denies  Agreement Not to Harm Self Yes  Description of Agreement verbal  Danger to Others  Danger to Others None reported or observed    "

## 2024-12-20 NOTE — ED Notes (Signed)
 IVC/Psych consult ordered/pending/ Legal paper work completed/scanned into Epic placed on chart

## 2024-12-20 NOTE — Consult Note (Signed)
 [facility] Psychiatric Consult Initial  Patient Name: .Eileen Moody  MRN: [id number]  DOB: 08-19-1996  Consult Order details:  Orders (From admission, onward)     Start     Ordered   12/19/24 2239  CONSULT TO CALL ACT TEAM       Ordering Provider: Floy Roberts, MD  Provider:  (Not yet assigned)  Question:  Reason for Consult?  Answer:  abnormal behavior   12/19/24 2238   12/19/24 2239  IP CONSULT TO PSYCHIATRY       Ordering Provider: Floy Roberts, MD  Provider:  (Not yet assigned)  Question Answer Comment  Reason for consult: Other (see comments)   Comments: abnormal behavior      12/19/24 2238             Mode of Visit: Tele-visit Virtual Statement:TELE PSYCHIATRY ATTESTATION & CONSENT As the provider for this telehealth consult, I attest that I verified the patient's identity using two separate identifiers, introduced myself to the patient, provided my credentials, disclosed my location, and performed this encounter via a HIPAA-compliant, real-time, face-to-face, two-way, interactive audio and video platform and with the full consent and agreement of the patient (or guardian as applicable.) Patient physical location: [facility]. Telehealth provider physical location: home office in state of [place] .   Video start time:   Video end time:      Psychiatry Consult Evaluation  Service Date: December 20, 2024 LOS:  LOS: 0 days  Chief Complaint Insect bite  Primary Psychiatric Diagnoses  Schizophrenia  Assessment  Eileen Moody is a 29 y.o. female admitted: Presented to the EDfor 12/19/2024  9:19 PM for evaluation of altered mental status and paranoia following EMS transport for a reported spider bite. She carries a psychiatric diagnosis of schizophrenia and has a reported past medical history of current pregnancy.  Her current presentation of paranoia, disorganized thought content, guardedness, and fixed false beliefs regarding pregnancy and  consent is most consistent with acute psychotic decompensation.  She meets criteria for involuntary commitment (IVC) based on impaired insight and judgment, inability to meaningfully participate in care decisions, and concerns for untreated psychosis impacting her safety and medical care during pregnancy.  Current outpatient psychotropic medications include quetiapine  (Seroquel ), reportedly at a reduced dose, and historically she has had a partial or unclear response to this medication. She was likely noncompliant or partially compliant with medications prior to admission as evidenced by reported dose reduction and current psychotic symptoms.  On initial examination, the patient was alert and oriented but exhibited significant paranoia, guarded behavior, disorganized beliefs, and impaired insight, without evidence of suicidal or homicidal ideation.  Diagnoses:  Active Hospital problems: Active Problems:   * No active hospital problems. *    Plan   ## Psychiatric Medication Recommendations:    ## Medical Decision Making Capacity: Not specifically addressed in this encounter   ## Disposition:-- Patient is recommended for inpatient admission.  ## Behavioral / Environmental: - No specific recommendations at this time.     ## Safety and Observation Level:  - Based on my clinical evaluation, I estimate the patient to be at no risk of self harm in the current setting. - At this time, we recommend  routine. This decision is based on my review of the chart including patient's history and current presentation, interview of the patient, mental status examination, and consideration of suicide risk including evaluating suicidal ideation, plan, intent, suicidal or self-harm behaviors, risk factors, and protective factors. This judgment  is based on our ability to directly address suicide risk, implement suicide prevention strategies, and develop a safety plan while the patient is in the clinical  setting. Please contact our team if there is a concern that risk level has changed.  CSSR Risk Category:C-SSRS RISK CATEGORY: No Risk  Suicide Risk Assessment: Patient has following modifiable risk factors for suicide: active mental illness (to encompass adhd, tbi, mania, psychosis, trauma reaction), which we are addressing by inpatient admission. Patient has following non-modifiable or demographic risk factors for suicide: psychiatric hospitalization Patient has the following protective factors against suicide: pregnancy  Thank you for this consult request. Recommendations have been communicated to the primary team.  We will recommend inpatient admission at this time.   [name], NP       History of Present Illness  Relevant Aspects of Hospital ED Course:  Admitted on 12/19/2024 for paranoia.   Patient Report:  The patient is a 29 year old female who presented to the ED via EMS from home with a complaint of a possible spider bite to her right thumb earlier in the day. She reports identifying the insect as a wolf spider. She reports thumb pain but denies other physical symptoms.  During triage, EMS and [place] [employer] expressed concern for an underlying mental health issue given the patients behavior and statements. The patient stated that she needed to protect her baby, pointed to her feet stating they were swollen, and reported beliefs related to her upbringing and needing to sit upright to protect her baby.  The patient reports that she is currently pregnant and states that she is pregnant with four babies, reporting an expected delivery date of January 27. She reports this is her fourth pregnancy and that she receives prenatal care at [facility].  She denies any mood or mental health concerns and repeatedly states, Nothing is wrong with me. She is adamant that she will not sign any paperwork and states that if she is kept in the hospital it would be illegal. She is  focused on returning to her apartment.  She denies suicidal ideation, homicidal ideation, auditory hallucinations, and visual hallucinations. She reports taking Seroquel , half a dose. She reports living alone.  Psych ROS:  Depression: no Anxiety:  yes Mania (lifetime and current): no Psychosis: (lifetime and current): yes  Review of Systems  Constitutional: Negative.   HENT: Negative.    Eyes: Negative.   Respiratory: Negative.    Cardiovascular: Negative.   Gastrointestinal: Negative.   Genitourinary: Negative.   Musculoskeletal: Negative.   Skin: Negative.   Neurological: Negative.      Psychiatric and Social History  Psychiatric History:  Information collected from Patient and Patient history  Prev Dx/Sx: Bipolar, Schizophrenia Current Psych Provider: not provided Home Meds (current): Seroquel  Previous Med Trials: not provided Therapy: unknown  Prior Psych Hospitalization: yes  Prior Self Harm: none reported Prior Violence: none reported  Family Psych History: none reported Family Hx suicide: none reported  Social History:  Developmental Hx: unknown Educational Hx: unknown Occupational Hx: unknown Legal Hx: unknown Living Situation: alone Spiritual Hx: unknown Access to weapons/lethal means: no   Substance History Alcohol: denies  Tobacco: cigarettes Illicit drugs: denies Prescription drug abuse: denies Rehab hx: denies  Exam Findings   Vital Signs:  Temp:  [98.1 F (36.7 C)] 98.1 F (36.7 C) (01/02 2137) Pulse Rate:  [112] 112 (01/02 2137) Resp:  [19] 19 (01/02 2137) BP: (124)/(106) 124/106 (01/02 2137) SpO2:  [91 %] 91 % (01/02  2137) Weight:  [74.8 kg] 74.8 kg (01/02 2138) Blood pressure (!) 124/106, pulse (!) 112, temperature 98.1 F (36.7 C), temperature source Oral, resp. rate 19, height 5' 5 (1.651 m), weight 74.8 kg, last menstrual period 05/01/2024, SpO2 91%, unknown if currently breastfeeding. Body mass index is 27.46  kg/m.  Physical Exam HENT:     Head: Normocephalic.     Nose: Nose normal.     Mouth/Throat:     Pharynx: Oropharynx is clear.  Pulmonary:     Effort: Pulmonary effort is normal.  Musculoskeletal:        General: Normal range of motion.     Cervical back: Normal range of motion.  Skin:    General: Skin is dry.  Neurological:     Mental Status: She is alert.    Other History   These have been pulled in through the EMR, reviewed, and updated if appropriate.  Family History:  The patient's family history is not on file.  Medical History: Past Medical History:  Diagnosis Date   Anemia    History of anemia    during pregnancy   Schizophrenia (HCC)    STD (sexually transmitted disease) 2015   H/o gonorrhea/chlamydia.  Treated    Surgical History: Past Surgical History:  Procedure Laterality Date   NO PAST SURGERIES       Medications:  Current Medications[1]  Allergies: Allergies[2]  Hazael Olveda, NP     [1]  Current Facility-Administered Medications:    prenatal vitamin w/FE, FA (NATACHEW) chewable tablet 1 tablet, 1 tablet, Oral, Q1200, Nicholaus Rolland BRAVO, MD  Current Outpatient Medications:    INVEGA  SUSTENNA 156 MG/ML SUSY injection, Inject 156 mg into the muscle every 30 (thirty) days. (Patient not taking: Reported on 06/20/2024), Disp: , Rfl:    nicotine  (NICODERM CQ  - DOSED IN MG/24 HR) 7 mg/24hr patch, Place 1 patch (7 mg total) onto the skin daily as needed (as needed). (Patient not taking: Reported on 06/20/2024), Disp: 28 patch, Rfl: 0   nicotine  polacrilex (NICORETTE ) 2 MG gum, Take 1 each (2 mg total) by mouth as needed for smoking cessation. (Patient not taking: Reported on 06/20/2024), Disp: 100 tablet, Rfl: 0   paliperidone  (INVEGA ) 6 MG 24 hr tablet, Take 1 tablet (6 mg total) by mouth daily. (Patient not taking: Reported on 06/20/2024), Disp: , Rfl:    QUEtiapine  (SEROQUEL ) 50 MG tablet, Take 50 mg by mouth at bedtime., Disp: , Rfl:  [2] No Known  Allergies

## 2024-12-20 NOTE — Progress Notes (Signed)
 Patient ID: Eileen Moody, female   DOB: 05-13-1996, 29 y.o.   MRN: [id number] CSW attempted PSA but patient was aggressive and asked to leave the dayroom and accompanied by security.

## 2024-12-20 NOTE — ED Notes (Signed)
 Patient given sandwich tray and ginger ale at this time.

## 2024-12-20 NOTE — Group Note (Signed)
 Date:  12/20/2024 Time:  10:08 PM  Group Topic/Focus:  Wrap-Up Group:   The focus of this group is to help patients review their daily goal of treatment and discuss progress on daily workbooks.    Additional Comments:  DIDN'T ATTEND  Kerri Katz 12/20/2024, 10:08 PM

## 2024-12-20 NOTE — Progress Notes (Signed)
 Patient admitted from [facility] - ED, report received from [place], [place]. Pt presents disorganized and paranoid. Pt believes they are here because of a spider bite. Pt oriented to the unit and her room. Pt just wanted to sleep. Pt given education, support, and encouragement to be active in her treatment plan. Pt being monitored Q 15 minutes for safety per unit protocol, remains safe on the unit

## 2024-12-20 NOTE — Plan of Care (Signed)
   Problem: Education: Goal: Emotional status will improve Outcome: Progressing Goal: Mental status will improve Outcome: Progressing

## 2024-12-20 NOTE — ED Notes (Signed)
 Patient belongings (2 bags): -black sandals -blue/white coat -black tank top -pink pants -beige purse with misc. Items including 2 cell phones.

## 2024-12-20 NOTE — Group Note (Signed)
 Date:  12/20/2024 Time:  2:05 PM  Group Topic/Focus:  Goals Group:   The focus of this group is to help patients establish daily goals to achieve during treatment and discuss how the patient can incorporate goal setting into their daily lives to aide in recovery.    Participation Level:  Did Not Attend   Camellia HERO Money Mckeithan 12/20/2024, 2:05 PM

## 2024-12-21 LAB — GROUP B STREP BY PCR: Group B strep by PCR: NEGATIVE

## 2024-12-21 MED ORDER — PRENATAL MULTIVITAMIN CH
1.0000 | ORAL_TABLET | Freq: Every day | ORAL | Status: DC
  Administered 2024-12-22 – 2025-01-01 (×11): 1 via ORAL
  Filled 2024-12-21 (×11): qty 1

## 2024-12-21 MED ORDER — COMPLETENATE 29-1 MG PO CHEW: 1.0000 | CHEWABLE_TABLET | Freq: Every day | ORAL | Status: DC

## 2024-12-21 NOTE — Progress Notes (Signed)
" °   12/21/24 1000  Psych Admission Type (Psych Patients Only)  Admission Status Involuntary  Psychosocial Assessment  Patient Complaints Anger;Irritability;Suspiciousness  Eye Contact Brief  Facial Expression Animated  Affect Apprehensive;Irritable  Speech Soft  Interaction Guarded;Defensive  Motor Activity Slow  Appearance/Hygiene Unremarkable  Behavior Characteristics Irritable;Agressive verbally  Mood Anxious  Thought Process  Coherency Disorganized  Content Preoccupation  Delusions None reported or observed  Perception WDL  Hallucination None reported or observed  Judgment Limited  Confusion Mild  Danger to Self  Current suicidal ideation? Denies  Agreement Not to Harm Self Yes  Description of Agreement Verbal  Danger to Others  Danger to Others None reported or observed    "

## 2024-12-21 NOTE — Progress Notes (Signed)
" °   12/20/24 2100  Psych Admission Type (Psych Patients Only)  Admission Status Involuntary  Psychosocial Assessment  Patient Complaints Irritability  Eye Contact Brief  Facial Expression Animated  Affect Apprehensive  Speech Soft  Interaction Guarded;Defensive  Motor Activity Slow  Appearance/Hygiene Unremarkable  Behavior Characteristics Appropriate to situation  Mood Anxious;Apprehensive;Sad  Thought Process  Coherency Disorganized  Content Preoccupation  Delusions None reported or observed  Perception WDL  Hallucination None reported or observed  Judgment Limited  Confusion Mild  Danger to Self  Current suicidal ideation? Denies  Agreement Not to Harm Self Yes  Description of Agreement Verbal  Danger to Others  Danger to Others None reported or observed   Unpredictable behaviors - belligerent and verbally aggressive unprovoked.  Requires multiple prompting.  "

## 2024-12-21 NOTE — BH Assessment (Signed)
 CSW unable to complete assessment with pt. CSW attempted to complete assessment, pt agitated, pt received PRN protocol per nursing staff.   CSW will attempt at a later time.   Lum Croft, MSW, [place] 12/21/2024 10:41 AM

## 2024-12-21 NOTE — Consult Note (Signed)
 [facility] OB-GYN ANTEPARTUM CONSULT Eileen Moody is a 29 y.o. [phone] at [redacted]w[redacted]d who is admitted to inpatient BH on an IVC for psychosis.  Estimated Date of Delivery: 01/15/25  Length of Stay:  1 Days. Admitted 12/20/2024  Subjective: Eileen Moody reports good fetal movement, although she believes she is pregnant with 4 boys.  She denies contractions, vaginal bleeding, LOF, HA, visual disturbances, and epigastric pain. She is currently responsive and cooperative with assessment, but staff reports agitation and aggression earlier today.  Vitals:  Blood pressure 118/82, pulse (!) 122, temperature 98.5 F (36.9 C), temperature source Oral, resp. rate 17, height 5' 5 (1.651 m), weight 74.8 kg, last menstrual period 05/01/2024, SpO2 98%, unknown if currently breastfeeding. Physical Examination: CONSTITUTIONAL: Well-developed, well-nourished female in no acute distress.  ABDOMEN: Soft, nontender, nondistended, gravid.  FHR: 155 bpm    Results for orders placed or performed during the hospital encounter of 12/19/24 (from the past 48 hours)  Urinalysis, Routine w reflex microscopic -Urine, Clean Catch     Status: Abnormal   Collection Time: 12/19/24 11:57 PM  Result Value Ref Range   Color, Urine AMBER (A) YELLOW    Comment: BIOCHEMICALS MAY BE AFFECTED BY COLOR   APPearance CLOUDY (A) CLEAR   Specific Gravity, Urine 1.032 (H) 1.005 - 1.030   pH 5.0 5.0 - 8.0   Glucose, UA NEGATIVE NEGATIVE mg/dL   Hgb urine dipstick NEGATIVE NEGATIVE   Bilirubin Urine NEGATIVE NEGATIVE   Ketones, ur NEGATIVE NEGATIVE mg/dL   Protein, ur 899 (A) NEGATIVE mg/dL   Nitrite NEGATIVE NEGATIVE   Leukocytes,Ua MODERATE (A) NEGATIVE   RBC / HPF 21-50 0 - 5 RBC/hpf   WBC, UA >50 0 - 5 WBC/hpf   Bacteria, UA FEW (A) NONE SEEN   Squamous Epithelial / HPF 21-50 0 - 5 /HPF   Mucus PRESENT    Non Squamous Epithelial PRESENT (A) NONE SEEN    Comment: Performed at [facility], [address] [place], [place]  [address]  Urine Drug Screen     Status: None   Collection Time: 12/19/24 11:57 PM  Result Value Ref Range   Opiates NEGATIVE NEGATIVE   Cocaine NEGATIVE NEGATIVE   Benzodiazepines NEGATIVE NEGATIVE   Amphetamines NEGATIVE NEGATIVE   Tetrahydrocannabinol NEGATIVE NEGATIVE   Barbiturates NEGATIVE NEGATIVE   Methadone Scn, Ur NEGATIVE NEGATIVE   Fentanyl  NEGATIVE NEGATIVE    Comment: (NOTE) Drug screen is for Medical Purposes only. Positive results are preliminary only. If confirmation is needed, notify lab within 5 days.  Drug Class                 Cutoff (ng/mL) Amphetamine and metabolites 1000 Barbiturate and metabolites 200 Benzodiazepine              200 Opiates and metabolites     300 Cocaine and metabolites     300 THC                         50 Fentanyl                     5 Methadone                   300  Trazodone  is metabolized in vivo to several metabolites,  including pharmacologically active m-CPP, which is excreted in the  urine.  Immunoassay screens for amphetamines and MDMA have potential  cross-reactivity with these compounds and may provide false  positive  result.  Performed at [facility], [address] [place], [place] [address]   CBC with Differential     Status: Abnormal   Collection Time: 12/20/24 12:18 AM  Result Value Ref Range   WBC 15.3 (H) 4.0 - 10.5 K/uL   RBC 4.70 3.87 - 5.11 MIL/uL   Hemoglobin 14.5 12.0 - 15.0 g/dL   HCT 57.5 63.9 - 53.9 %   MCV 90.2 80.0 - 100.0 fL   MCH 30.9 26.0 - 34.0 pg   MCHC 34.2 30.0 - 36.0 g/dL   RDW 87.0 88.4 - 84.4 %   Platelets 274 150 - 400 K/uL   nRBC 0.0 0.0 - 0.2 %   Neutrophils Relative % 70 %   Neutro Abs 10.8 (H) 1.7 - 7.7 K/uL   Lymphocytes Relative 21 %   Lymphs Abs 3.2 0.7 - 4.0 K/uL   Monocytes Relative 7 %   Monocytes Absolute 1.1 (H) 0.1 - 1.0 K/uL   Eosinophils Relative 1 %   Eosinophils Absolute 0.1 0.0 - 0.5 K/uL   Basophils Relative 0 %   Basophils Absolute 0.1 0.0 - 0.1  K/uL   Immature Granulocytes 1 %   Abs Immature Granulocytes 0.07 0.00 - 0.07 K/uL    Comment: Performed at [facility], [address], [place], [place] [address]  Basic metabolic panel     Status: Abnormal   Collection Time: 12/20/24 12:18 AM  Result Value Ref Range   Sodium 135 135 - 145 mmol/L   Potassium 3.5 3.5 - 5.1 mmol/L   Chloride 103 98 - 111 mmol/L   CO2 17 (L) 22 - 32 mmol/L   Glucose, Bld 90 70 - 99 mg/dL    Comment: Glucose reference range applies only to samples taken after fasting for at least 8 hours.   BUN 9 6 - 20 mg/dL   Creatinine, Ser 9.09 0.44 - 1.00 mg/dL   Calcium 9.4 8.9 - 89.6 mg/dL   GFR, Estimated >39 >39 mL/min    Comment: (NOTE) Calculated using the CKD-EPI Creatinine Equation (2021)    Anion gap 15 5 - 15    Comment: Performed at [facility], [address] [place], [place] [address]  Salicylate level     Status: Abnormal   Collection Time: 12/20/24 12:18 AM  Result Value Ref Range   Salicylate Lvl <7.0 (L) 7.0 - 30.0 mg/dL    Comment: Performed at [facility], [address] [place], [place] [address]  Acetaminophen  level     Status: Abnormal   Collection Time: 12/20/24 12:18 AM  Result Value Ref Range   Acetaminophen  (Tylenol ), Serum <10 (L) 10 - 30 ug/mL    Comment: (NOTE) Toxic concentrations can be more effectively related to post dose interval; >200, >100, and >50 ug/mL serum concentrations correspond to toxic concentrations at 4, 8, and 12 hours post dose, respectively.  Performed at [facility], [address] [place], [place] [address]     No results found.  Current scheduled medications  nicotine   21 mg Transdermal Q0600   [START ON 12/22/2024] prenatal multivitamin  1 tablet Oral Q1200   QUEtiapine   50 mg Oral QHS    I have reviewed the patient's current medications.  ASSESSMENT: Principal Problem:   Schizophrenia, paranoid (HCC)   PLAN: GBS swab collected Prenatal  vitamin daily BPP tomorrow  Notify OB team if Eileen Moody experiences ctx, LOF, vaginal bleeding, elevated BPs, severe headache In agreement with NP's plan  to transfer to [facility] for higher-level inpatient perinatal psychiatric care given acuity of illness at near-term  Eleanor Canny, CNM [place] [facility] at [facility]

## 2024-12-21 NOTE — Group Note (Signed)
 Date:  12/21/2024 Time:  10:34 PM    Additional Comments:  Did not attend group.  Eileen Moody 12/21/2024, 10:34 PM

## 2024-12-21 NOTE — Plan of Care (Signed)
   Problem: Education: Goal: Knowledge of Eileen Moody General Education information/materials will improve Outcome: Progressing Goal: Emotional status will improve Outcome: Progressing Goal: Mental status will improve Outcome: Progressing Goal: Verbalization of understanding the information provided will improve Outcome: Progressing   Problem: Activity: Goal: Interest or engagement in activities will improve Outcome: Progressing Goal: Sleeping patterns will improve Outcome: Progressing   Problem: Coping: Goal: Ability to verbalize frustrations and anger appropriately will improve Outcome: Progressing Goal: Ability to demonstrate self-control will improve Outcome: Progressing
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
## 2024-12-21 NOTE — Plan of Care (Signed)
" °  Problem: Activity: Goal: Interest or engagement in activities will improve Outcome: Progressing   Problem: Education: Goal: Mental status will improve Outcome: Progressing Goal: Verbalization of understanding the information provided will improve Outcome: Progressing   Problem: Coping: Goal: Ability to verbalize frustrations and anger appropriately will improve Outcome: Progressing   Problem: Health Behavior/Discharge Planning: Goal: Compliance with treatment plan for underlying cause of condition will improve Outcome: Progressing   Problem: Physical Regulation: Goal: Ability to maintain clinical measurements within normal limits will improve Outcome: Progressing   Problem: Safety: Goal: Periods of time without injury will increase Outcome: Progressing   "

## 2024-12-22 ENCOUNTER — Inpatient Hospital Stay: Payer: MEDICAID

## 2024-12-22 DIAGNOSIS — F200 Paranoid schizophrenia: Secondary | ICD-10-CM | POA: Diagnosis not present

## 2024-12-22 NOTE — Group Note (Signed)
 Recreation Therapy Group Note   Group Topic:Coping Skills  Group Date: 12/22/2024 Start Time: 1050 End Time: 1135 Facilitators: Celestia Jeoffrey BRAVO, LRT, CTRS Location: Craft Room  Group Description: Mind Map.  Patient was provided a blank template of a diagram with 32 blank boxes in a tiered system, branching from the center (similar to a bubble chart). LRT directed patients to label the middle of the diagram Coping Skills. LRT and patients then came up with 8 different coping skills as examples. Pt were directed to record their coping skills in the 2nd tier boxes closest to the center.  Patients would then share their coping skills with the group as LRT wrote them out. LRT gave a handout of 99 different coping skills at the end of group.   Goal Area(s) Addressed: Patients will be able to define coping skills. Patient will identify new coping skills.  Patient will increase communication.   Affect/Mood: Blunted, Flat, and Restricted   Participation Level: Non-verbal    Clinical Observations/Individualized Feedback: Tateyene came late to group and left early. Pt did not interact with LRT or peers while present.   Plan: Continue to engage patient in RT group sessions 2-3x/week.   Jeoffrey BRAVO Celestia, LRT, CTRS 12/22/2024 12:25 PM

## 2024-12-22 NOTE — Group Note (Signed)
 LCSW Group Therapy Note  Group Date: 12/22/2024 Start Time: 1315 End Time: 1400   Type of Therapy and Topic:  Group Therapy: Anger Cues and Responses  Participation Level:  Did Not Attend   Description of Group:   In this group, patients learned how to recognize the physical, cognitive, emotional, and behavioral responses they have to anger-provoking situations.  They identified a recent time they became angry and how they reacted.  They analyzed how their reaction was possibly beneficial and how it was possibly unhelpful.  The group discussed a variety of healthier coping skills that could help with such a situation in the future.  Focus was placed on how helpful it is to recognize the underlying emotions to our anger, because working on those can lead to a more permanent solution as well as our ability to focus on the important rather than the urgent.  Therapeutic Goals: Patients will remember their last incident of anger and how they felt emotionally and physically, what their thoughts were at the time, and how they behaved. Patients will identify how their behavior at that time worked for them, as well as how it worked against them. Patients will explore possible new behaviors to use in future anger situations. Patients will learn that anger itself is normal and cannot be eliminated, and that healthier reactions can assist with resolving conflict rather than worsening situations.  Summary of Patient Progress:   X  Therapeutic Modalities:   Cognitive Behavioral Therapy    Sherryle JINNY Margo, LCSW 12/22/2024  2:36 PM

## 2024-12-22 NOTE — Group Note (Deleted)
 Date:  12/22/2024 Time:  12:45 PM  Group Topic/Focus:  Building Self Esteem:   The Focus of this group is helping patients become aware of the effects of self-esteem on their lives, the things they and others do that enhance or undermine their self-esteem, seeing the relationship between their level of self-esteem and the choices they make and learning ways to enhance self-esteem.     Participation Level:  {BHH PARTICIPATION OZCZO:77735}  Participation Quality:  {BHH PARTICIPATION QUALITY:22265}  Affect:  {BHH AFFECT:22266}  Cognitive:  {BHH COGNITIVE:22267}  Insight: {BHH Insight2:20797}  Engagement in Group:  {BHH ENGAGEMENT IN HMNLE:77731}  Modes of Intervention:  {BHH MODES OF INTERVENTION:22269}  Additional Comments:  ***  Eileen Moody Brion Hedges 12/22/2024, 12:45 PM

## 2024-12-22 NOTE — Progress Notes (Signed)
" °   12/21/24 2039  Psych Admission Type (Psych Patients Only)  Admission Status Involuntary  Psychosocial Assessment  Patient Complaints None  Eye Contact Brief  Facial Expression Animated  Affect Appropriate to circumstance  Speech Soft  Interaction Guarded  Motor Activity Slow  Appearance/Hygiene Unremarkable  Behavior Characteristics Appropriate to situation  Mood Anxious  Aggressive Behavior  Effect No apparent injury  Thought Process  Coherency Disorganized  Content Preoccupation  Delusions None reported or observed  Perception WDL  Hallucination None reported or observed  Judgment Limited  Confusion Mild  Danger to Self  Current suicidal ideation? Denies  Agreement Not to Harm Self Yes  Description of Agreement Verbal  Danger to Others  Danger to Others None reported or observed    "

## 2024-12-22 NOTE — Plan of Care (Signed)
" °  Problem: Activity: Goal: Interest or engagement in activities will improve Outcome: Not Progressing Goal: Sleeping patterns will improve Outcome: Not Progressing   Problem: Coping: Goal: Ability to verbalize frustrations and anger appropriately will improve Outcome: Not Progressing Goal: Ability to demonstrate self-control will improve Outcome: Not Progressing   Problem: Safety: Goal: Periods of time without injury will increase Outcome: Not Progressing   "

## 2024-12-22 NOTE — Progress Notes (Addendum)
 Pt continues to be maintained on 1:1 with sitter @ bedside, she presented anxious and restless most of the evening, demanding to have select staff sit with her, female of color. Pt was compliant with taking her medications, thoughts continues to be disorganized and she is easily agitated due to her paranoid thinking.   12/22/24 2100  Psych Admission Type (Psych Patients Only)  Admission Status Involuntary  Psychosocial Assessment  Patient Complaints Anxiety  Eye Contact Glaring  Facial Expression Animated  Affect Appropriate to circumstance  Speech Soft  Interaction Cautious;Guarded  Motor Activity Slow  Appearance/Hygiene In scrubs  Behavior Characteristics Guarded;Restless  Mood Anxious;Preoccupied  Thought Process  Coherency Disorganized  Content Preoccupation;Paranoia  Delusions None reported or observed  Perception UTA  Hallucination None reported or observed  Judgment Poor  Confusion Mild  Danger to Self  Current suicidal ideation? Denies  Agreement Not to Harm Self Yes  Description of Agreement verbal  Danger to Others  Danger to Others None reported or observed

## 2024-12-22 NOTE — Plan of Care (Signed)

## 2024-12-22 NOTE — Group Note (Signed)
 Date:  12/22/2024 Time:  8:52 PM  Group Topic/Focus:  Wrap-Up Group:   The focus of this group is to help patients review their daily goal of treatment and discuss progress on daily workbooks.    Participation Level:  Did Not Attend    Eileen Moody 12/22/2024, 8:52 PM
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
## 2024-12-22 NOTE — Plan of Care (Signed)

## 2024-12-22 NOTE — Group Note (Signed)
 Recreation Therapy Group Note   Group Topic:General Recreation  Group Date: 12/22/2024 Start Time: 1530 End Time: 1615 Facilitators: Celestia Jeoffrey BRAVO, LRT, CTRS Location: Courtyard  Group Description: [employer]. LRT and patients played games of basketball, drew with chalk, and played corn hole while outside in the courtyard while getting fresh air and sunlight. Music was being played in the background. LRT and peers conversed about different games they have played before, what they do in their free time and anything else that is on their minds. LRT encouraged pts to drink water after being outside, sweating and getting their heart rate up.  Goal Area(s) Addressed: Patient will build on frustration tolerance skills. Patients will partake in a competitive play game with peers. Patients will gain knowledge of new leisure interest/hobby.    Affect/Mood: N/A   Participation Level: Did not attend    Clinical Observations/Individualized Feedback: Patient did not attend.  Plan: Continue to engage patient in RT group sessions 2-3x/week.   Jeoffrey BRAVO Celestia, LRT, CTRS 12/22/2024 5:01 PM

## 2024-12-23 DIAGNOSIS — F200 Paranoid schizophrenia: Secondary | ICD-10-CM | POA: Diagnosis not present

## 2024-12-23 MED ORDER — QUETIAPINE FUMARATE 200 MG PO TABS
200.0000 mg | ORAL_TABLET | Freq: Every day | ORAL | Status: DC
  Administered 2024-12-23 – 2025-01-01 (×7): 200 mg via ORAL
  Filled 2024-12-23 (×8): qty 1

## 2024-12-23 MED ORDER — DIPHENHYDRAMINE HCL 50 MG/ML IJ SOLN
50.0000 mg | Freq: Once | INTRAMUSCULAR | Status: AC | PRN
  Administered 2024-12-23: 50 mg via INTRAMUSCULAR
  Filled 2024-12-23: qty 1

## 2024-12-23 MED ORDER — DIPHENHYDRAMINE HCL 50 MG/ML IJ SOLN: 50.0000 mg | Freq: Once | INTRAMUSCULAR | Status: DC

## 2024-12-23 NOTE — Progress Notes (Signed)
" °   12/23/24 2100  Psych Admission Type (Psych Patients Only)  Admission Status Involuntary  Psychosocial Assessment  Patient Complaints None  Eye Contact Watchful  Facial Expression Sullen  Affect Preoccupied;Sullen  Speech Soft  Interaction Intrusive  Motor Activity Pacing  Appearance/Hygiene Disheveled;In scrubs  Behavior Characteristics Intrusive;Pacing  Mood Preoccupied  Thought Process  Coherency Disorganized  Content Preoccupation  Delusions Paranoid  Perception UTA (Patient not answering but appears to be responding to internal stimuli)  Hallucination UTA  Judgment Impaired  Confusion Mild  Danger to Self  Current suicidal ideation?  (Patient did not answer the question)  Danger to Others  Danger to Others None reported or observed    "
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
## 2024-12-23 NOTE — Plan of Care (Signed)
" °  Problem: Education: Goal: Emotional status will improve 12/23/2024 2106 by Neill Leita POUR, RN Outcome: Not Progressing 12/23/2024 1625 by Neill Leita POUR, RN Outcome: Not Progressing Goal: Mental status will improve 12/23/2024 2106 by Neill Leita POUR, RN Outcome: Not Progressing 12/23/2024 1625 by Neill Leita POUR, RN Outcome: Not Progressing   Problem: Activity: Goal: Interest or engagement in activities will improve Outcome: Progressing   Problem: Coping: Goal: Ability to verbalize frustrations and anger appropriately will improve Outcome: Not Progressing   Problem: Health Behavior/Discharge Planning: Goal: Compliance with treatment plan for underlying cause of condition will improve Outcome: Not Progressing   Problem: Physical Regulation: Goal: Ability to maintain clinical measurements within normal limits will improve 12/23/2024 2106 by Neill Leita POUR, RN Outcome: Progressing 12/23/2024 1625 by Neill Leita POUR, RN Outcome: Progressing   Problem: Safety: Goal: Periods of time without injury will increase 12/23/2024 2106 by Neill Leita POUR, RN Outcome: Progressing 12/23/2024 1625 by Neill Leita POUR, RN Outcome: Progressing   "

## 2024-12-23 NOTE — Progress Notes (Signed)
" °   12/23/24 1600  Psych Admission Type (Psych Patients Only)  Admission Status Involuntary  Psychosocial Assessment  Patient Complaints None  Eye Contact Glaring;Intense;Watchful  Facial Expression Sullen  Affect Irritable;Preoccupied  Speech Soft;Aggressive  Interaction Hostile;Intrusive  Motor Activity Pacing  Appearance/Hygiene Disheveled;In scrubs  Behavior Characteristics Intrusive;Irritable;Pacing  Mood Labile;Irritable;Preoccupied  Aggressive Behavior  Targets Other (Comment) (Other patient's)  Type of Behavior Threatening;Unprovoked  Effect No apparent injury  Thought Process  Coherency Disorganized;Blocking  Content Preoccupation;Paranoia  Delusions Paranoid  Perception UTA (Patient not answering but appears to be responding to internal stimuli)  Hallucination UTA (Patient appears to be responding to internal stimuli by mumbling to herself, laughing by herself in the hallway and making inappropriate remarks not related to the topic or question.)  Judgment Impaired  Confusion Mild  Danger to Self  Current suicidal ideation?  (Patient will not answer the question.)  Danger to Others  Danger to Others Reported or observed  Danger to Others Abnormal  Harmful Behavior to others Threats of violence towards other people observed or expressed  (Patient glares at other patients, f follows other patients and takes aggressive stances towards them.)  Destructive Behavior No threats or harm toward property    "

## 2024-12-23 NOTE — BHH Counselor (Signed)
 CSW met with the patient to discuss aftercare referral, SPE as well as whereabouts of her children.  Patient responded no to every question asked regardless if that was an appropriate response.   CSW to continue to assess.   Sherryle Margo, MSW, LCSW 12/23/2024 4:19 PM

## 2024-12-23 NOTE — Group Note (Signed)
 Recreation Therapy Group Note   Group Topic:Animal Assisted Therapy   Group Date: 12/23/2024 Start Time: 1000 End Time: 1020 Facilitators: Celestia Jeoffrey BRAVO, LRT, CTRS Location: Dayroom  Group Description: AAA. Animal-Assisted Activity provides opportunities for motivational, educational, therapeutic and/or recreational benefits to enhance quality of life. [name] and [name] visited the unit to interact with patients.    Goal Areas Addressed:  Reduced anxiety and stress Improved mood Increased social interaction Enhanced communication skills Reduced loneliness and isolation Improved emotional regulation   Affect/Mood: Blunted   Participation Level: Minimal    Clinical Observations/Individualized Feedback: Eileen Moody was present in group. Pt was observed to be pointing at individual peers and counting them. Pt was also observed talking to herself as if responding to internal stimuli. Pt did interact with the dog, [name], appropriately.   Plan: Continue to engage patient in RT group sessions 2-3x/week.   Jeoffrey BRAVO Celestia, LRT, CTRS 12/23/2024 11:42 AM

## 2024-12-23 NOTE — Progress Notes (Signed)
 Patient pacing the hallways and following another patient. Patient appears to be internally preoccupied as she mumbles to herself, laughs inappropriately in the hallway and makes comments to staff such as is that your dead son.   Multiple staff members attempted to explain the rationale of medications to patient; however, patient continued to ask the same questions and would intermittently interrupt to ask staff is that your dead son and cannibalism.   Due to concern for other patient's safety and increasing agitation by the patient, IM medications administered; see MAR. Patient allowed staff to administer medications without issue.

## 2024-12-23 NOTE — Group Note (Signed)
 Recreation Therapy Group Note   Group Topic:Health and Wellness  Group Date: 12/23/2024 Start Time: 1445 End Time: 1545 Facilitators: Celestia Jeoffrey BRAVO, LRT, CTRS Location: Courtyard  Group Description: [employer]. LRT and patients played games of basketball, drew with chalk, and played corn hole while outside in the courtyard while getting fresh air and sunlight. Music was being played in the background. LRT and peers conversed about different games they have played before, what they do in their free time and anything else that is on their minds. LRT encouraged pts to drink water after being outside, sweating and getting their heart rate up.  Goal Area(s) Addressed: Patient will build on frustration tolerance skills. Patients will partake in a competitive play game with peers. Patients will gain knowledge of new leisure interest/hobby.    Affect/Mood: N/A   Participation Level: Did not attend    Clinical Observations/Individualized Feedback: Patient did not attend.  Plan: Continue to engage patient in RT group sessions 2-3x/week.   Jeoffrey BRAVO Celestia, LRT, CTRS 12/23/2024 3:56 PM
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
## 2024-12-23 NOTE — BHH Counselor (Signed)
 CSW received call from [place] with [facility] regarding referral made by provider.  CSW updated that referral is still requested at this time.   Rocky reports that she will reach out to the provider as well as have the provider at [facility] reach out to our provider.   Call terminated with no issue.  Erin immediately returned call and reported that the referral will go through psych instead of maternity. She requested that the following information be sent: Psych notes, including nursing notes, labs, MAR.  CSW has sent referral and received confirmation page from fax.  Sherryle Margo, MSW, LCSW 12/23/2024 1:27 PM

## 2024-12-23 NOTE — Progress Notes (Signed)
 Patient eating dinner in dayroom. No further pacing or following of other patients noted. Will continue to monitor.

## 2024-12-23 NOTE — Plan of Care (Signed)
   Problem: Education: Goal: Emotional status will improve Outcome: Not Progressing Goal: Mental status will improve Outcome: Not Progressing

## 2024-12-23 NOTE — Group Note (Signed)
 Date:  12/23/2024 Time:  10:18 PM  Group Topic/Focus:  Primary and Secondary Emotions:   The focus of this group is to discuss the difference between primary and secondary emotions. Wrap-Up Group:   The focus of this group is to help patients review their daily goal of treatment and discuss progress on daily workbooks.    Participation Level:  Did Not Attend   Arlester CHRISTELLA Servant 12/23/2024, 10:18 PM

## 2024-12-23 NOTE — Group Note (Signed)
 Date:  12/23/2024 Time:  7:35 AM  Group Topic/Focus:  Wellness Toolbox:   The focus of this group is to discuss various aspects of wellness, balancing those aspects and exploring ways to increase the ability to experience wellness.  Patients will create a wellness toolbox for use upon discharge.    Participation Level:  Did Not Attend  Leigh VEAR Pais 12/23/2024, 7:35 AM

## 2024-12-23 NOTE — Group Note (Signed)
 [facility] LCSW Group Therapy Note   Group Date: 12/23/2024 Start Time: 1300 End Time: 1400  Type of Therapy/Topic:  Group Therapy:  Feelings about Diagnosis  Participation Level:  None   Description of Group:    This group will allow patients to explore their thoughts and feelings about diagnoses they have received. Patients will be guided to explore their level of understanding and acceptance of these diagnoses. Facilitator will encourage patients to process their thoughts and feelings about the reactions of others to their diagnosis, and will guide patients in identifying ways to discuss their diagnosis with significant others in their lives. This group will be process-oriented, with patients participating in exploration of their own experiences as well as giving and receiving support and challenge from other group members.   Therapeutic Goals: 1. Patient will demonstrate understanding of diagnosis as evidence by identifying two or more symptoms of the disorder:  2. Patient will be able to express two feelings regarding the diagnosis 3. Patient will demonstrate ability to communicate their needs through discussion and/or role plays  Summary of Patient Progress: Patient was present for the majority of group. During time in the room she did not participate in the discussion but was actively responding to internal stimuli. Pt had an entire conversation with herself while in the room and laughed and giggled throughout group's discussion.   Therapeutic Modalities:   Cognitive Behavioral Therapy Brief Therapy Feelings Identification    Nadara JONELLE Fam, LCSW

## 2024-12-23 NOTE — Group Note (Signed)
 Date:  12/23/2024 Time:  7:52 AM  Group Topic/Focus:  Self Care:   The focus of this group is to help patients understand the importance of self-care in order to improve or restore emotional, physical, spiritual, interpersonal, and financial health. MHT took patients outside to get some air and enjoy nature.    Participation Level:  Did Not Attend  Eileen Moody 12/23/2024, 7:52 AM

## 2024-12-23 NOTE — Plan of Care (Signed)
" °  Problem: Education: Goal: Emotional status will improve Outcome: Not Progressing Goal: Mental status will improve Outcome: Not Progressing   Problem: Activity: Goal: Interest or engagement in activities will improve Outcome: Progressing   Problem: Coping: Goal: Ability to verbalize frustrations and anger appropriately will improve Outcome: Not Progressing   Problem: Health Behavior/Discharge Planning: Goal: Compliance with treatment plan for underlying cause of condition will improve Outcome: Not Progressing   Problem: Physical Regulation: Goal: Ability to maintain clinical measurements within normal limits will improve Outcome: Progressing   Problem: Safety: Goal: Periods of time without injury will increase Outcome: Progressing   "

## 2024-12-24 ENCOUNTER — Encounter: Payer: Self-pay | Admitting: Psychiatry

## 2024-12-24 DIAGNOSIS — F200 Paranoid schizophrenia: Secondary | ICD-10-CM | POA: Diagnosis not present

## 2024-12-24 NOTE — Progress Notes (Signed)
" °   12/24/24 1800  Psych Admission Type (Psych Patients Only)  Admission Status Involuntary  Psychosocial Assessment  Patient Complaints None  Eye Contact Watchful  Facial Expression Animated;Fixed smile  Affect Preoccupied;Inconsistent with thought content  Airline Pilot;Intrusive  Motor Activity Pacing;Slow;Restless (paces/restless at times)  Appearance/Hygiene In scrubs;Improved  Behavior Characteristics Cooperative;Agressive verbally;Impulsive;Intrusive;Pacing;Restless;Resistant to care (resistant to care from staff members other than African Americans)  Mood Labile;Preoccupied  Aggressive Behavior  Effect No apparent injury  Thought Process  Coherency Disorganized  Content Preoccupation;Delusions  Delusions Paranoid;Persecutory;Other (Comment) (patient believes that she is pregnant with four boys.)  Perception Derealization;Hallucinations  Hallucination Auditory (patient denies, but is observed responding to internal stimuli)  Judgment Poor  Confusion Mild  Danger to Self  Current suicidal ideation? Denies  Agreement Not to Harm Self Yes  Description of Agreement Verbal  Danger to Others  Danger to Others None reported or observed  Danger to Others Abnormal  Harmful Behavior to others No threats or harm toward other people  Destructive Behavior No threats or harm toward property    "

## 2024-12-24 NOTE — Progress Notes (Addendum)
 Ultrasound just called wanting to get patient up there for imaging. This writer went to ask patient if she would allow staff to take her back up to complete the ultrasound. Patient stated my Mom is coming. Staff stated to patient that she would have about thirty minutes before visitation starts to go upstairs. Patient initially agreed, but when this writer stated that there were no African American technicians available, patient declined on going. This clinical research associate informed the Ultrasound technician of this and she stated that she would let the MD know to see what the next steps would be.

## 2024-12-24 NOTE — Progress Notes (Signed)
 Patient came up to the nurses station asking for a new set of scrubs. This clinical research associate provided them to her. After about five minutes, patient came back up and stated to this writer, I put my contaminated scrubs on the floor for y'all. So, do your job.

## 2024-12-24 NOTE — Plan of Care (Signed)
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
## 2024-12-24 NOTE — Progress Notes (Signed)
 It was reported that patient refused morning vitals.

## 2024-12-24 NOTE — Group Note (Signed)
 Date:  12/24/2024 Time:  10:40 AM  Group Topic/Focus:  Healthy Communication:   The focus of this group is to discuss communication, barriers to communication, as well as healthy ways to communicate with others.    Participation Level:  Active  Participation Quality:  Appropriate  Affect:  Appropriate  Cognitive:  Appropriate  Insight: Appropriate  Engagement in Group:  Engaged  Modes of Intervention:  Activity  Additional Comments:    Arieliz Latino 12/24/2024, 10:40 AM

## 2024-12-24 NOTE — Group Note (Signed)
 [facility] LCSW Group Therapy Note   Group Date: 12/24/2024 Start Time: 1246 End Time: 1405   Type of Therapy/Topic:  Group Therapy:  Emotion Regulation  Participation Level:  Minimal   Mood:  Description of Group:    The purpose of this group is to assist patients in learning to regulate negative emotions and experience positive emotions. Patients will be guided to discuss ways in which they have been vulnerable to their negative emotions. These vulnerabilities will be juxtaposed with experiences of positive emotions or situations, and patients challenged to use positive emotions to combat negative ones. Special emphasis will be placed on coping with negative emotions in conflict situations, and patients will process healthy conflict resolution skills.  Therapeutic Goals: Patient will identify two positive emotions or experiences to reflect on in order to balance out negative emotions:  Patient will label two or more emotions that they find the most difficult to experience:  Patient will be able to demonstrate positive conflict resolution skills through discussion or role plays:   Summary of Patient Progress:   During group, patient and group explored the ways in which our thoughts can impact our feelings which impacts our behaviors. The patient actively engaged in the group session and shared personal strategies for regulating a range of emotions. The group, along with the facilitator, explored various situations that triggered both safe and unsafe feelings, and discussed effective methods for emotional regulation to support improved outcomes. The patient was open, supportive of peers, and receptive to feedback, contributing positively to the group dynamic.      Therapeutic Modalities:   Cognitive Behavioral Therapy Feelings Identification Dialectical Behavioral Therapy   Alveta CHRISTELLA Kerns, LCSW

## 2024-12-24 NOTE — Progress Notes (Addendum)
 " Consult Note   SERVICE: Gynecology   Patient Name: Eileen Moody Patient MRN:   [id number]   HPI: Eileen Moody is a 29 y.o. [phone] with a history of schizophrenia who is admitted to inpatient behavior health for altered mental status and paranoia. In regards to her OB history she has received the following care as can be found in Care Everywhere in Epic:  06/23/2024- ED visit to [facility] - US  SIUP, 10+4 with [facility] 01/15/2025 07/03/2024- ED visit to [facility] while in patient at [facility] for back pain - US  SIUP 11+6 08/27/2024 -Triage Visit at [facility] for leaking fluids, 21+1, FHT 138, BP 119/60 ROM neg, wet prep neg 09/18/2024 - Anatomy US  [facility], 23+1, no anomalies, Growth 27%, AC 27%.  10/09/2024 - NOB at [facility]. 26+2, BP 121/81, Declined flu vaccine, Reviewed patient taking quietipine for bipolar disorder. There is not lab work available in [place] for this visit.   She was brought by EMS to [facility] on 1/2 with concerns over a spider bite and was then placed under IVC. The OB team attempted to evaluated this patient and was able to get a partial BPP on 1/5 that showed cephalic position, FHT of 147 and +movement and +breathing. The patient refused the remainder of the ultrasound. We were able to obtain a GBS swab which was presumptive negative. Her CBC was WNL and UDS negative during this admission.   Today, Spendlove, RN and I went to the [facility] unit where the patient allowed the RN to listen to [facility] which were in the 140s and denied any contractions or LOF. She stated that the babies are moving.  I have ordered another BPP and growth in the hopes that we are able to complete this evaluation.    Past Obstetrical History: OB History     Gravida  5   Para  3   Term  3   Preterm      AB  1   Living  3      SAB  1   IAB      Ectopic      Multiple  0   Live Births  3            Past Medical History: Past Medical History:  Diagnosis Date   Anemia     History of anemia    during pregnancy   Schizophrenia (HCC)    STD (sexually transmitted disease) 2015   H/o gonorrhea/chlamydia.  Treated    Past Surgical History:   Past Surgical History:  Procedure Laterality Date   NO PAST SURGERIES      Family History:  family history is not on file.  Social History:  Social History   Socioeconomic History   Marital status: Single    Spouse name: Eileen Moody   Number of children: 2   Years of education: Not on file   Highest education level: Not on file  Occupational History   Not on file  Tobacco Use   Smoking status: Every Day    Current packs/day: 1.00    Average packs/day: 1 pack/day for 12.0 years (12.0 ttl pk-yrs)    Types: Cigarettes    Start date: 2014   Smokeless tobacco: Never  Substance and Sexual Activity   Alcohol use: No   Drug use: No   Sexual activity: Yes    Birth control/protection: None  Other Topics Concern   Not on file  Social History Narrative   Not  on file   Social Drivers of Health   Tobacco Use: High Risk (12/20/2024)   Patient History    Smoking Tobacco Use: Every Day    Smokeless Tobacco Use: Never    Passive Exposure: Not on file  Financial Resource Strain: Low Risk (01/29/2023)   Received from [facility]   Overall Financial Resource Strain (CARDIA)    Difficulty of Paying Living Expenses: Not hard at all  Food Insecurity: No Food Insecurity (12/20/2024)   Epic    Worried About Radiation Protection Practitioner of Food in the Last Year: Never true    Ran Out of Food in the Last Year: Never true  Transportation Needs: No Transportation Needs (12/20/2024)   Epic    Lack of Transportation (Medical): No    Lack of Transportation (Non-Medical): No  Physical Activity: Insufficiently Active (01/29/2023)   Received from [facility]   Exercise Vital Sign    On average, how many days per week do you engage in moderate to strenuous exercise (like a brisk walk)?: 2 days    On average, how many minutes do you engage in  exercise at this level?: 30 min  Stress: No Stress Concern Present (01/29/2023)   Received from [facility] of Occupational Health - Occupational Stress Questionnaire    Feeling of Stress : Not at all  Social Connections: Socially Isolated (12/20/2024)   Social Connection and Isolation Panel    Frequency of Communication with Friends and Family: Twice a week    Frequency of Social Gatherings with Friends and Family: Twice a week    Attends Religious Services: Never    Database Administrator or Organizations: No    Attends Banker Meetings: Never    Marital Status: Never married  Intimate Partner Violence: Not At Risk (12/20/2024)   Epic    Fear of Current or Ex-Partner: No    Emotionally Abused: No    Physically Abused: No    Sexually Abused: No  Depression (PHQ2-9): Not on file  Alcohol Screen: Low Risk (12/20/2024)   Alcohol Screen    Last Alcohol Screening Score (AUDIT): 0  Housing: Low Risk (12/20/2024)   Epic    Unable to Pay for Housing in the Last Year: No    Number of Times Moved in the Last Year: 0    Homeless in the Last Year: No  Utilities: Not At Risk (12/20/2024)   Epic    Threatened with loss of utilities: No  Health Literacy: Not on file    Home Medications:  Medications reconciled in EPIC  Medications Ordered Prior to Encounter[1]  Allergies:  Allergies[2]  Physical Exam:  Pulse Rate:  [117] 117 (01/06 1610) Resp:  [17] 17 (01/06 1610) BP: (138)/(88) 138/88 (01/06 1610) SpO2:  [96 %] 96 % (01/06 1610)    Labs/Studies:   CBC and Coags:  Lab Results  Component Value Date   WBC 15.3 (H) 12/20/2024   NEUTOPHILPCT 70 12/20/2024   EOSPCT 1 12/20/2024   BASOPCT 0 12/20/2024   LYMPHOPCT 21 12/20/2024   HGB 14.5 12/20/2024   HCT 42.4 12/20/2024   MCV 90.2 12/20/2024   PLT 274 12/20/2024   CMP:  Lab Results  Component Value Date   NA 135 12/20/2024   K 3.5 12/20/2024   CL 103 12/20/2024   CO2 17 (L) 12/20/2024   BUN  9 12/20/2024   CREATININE 0.90 12/20/2024   CREATININE 0.84 06/20/2024   CREATININE 0.82 05/23/2022  PROT 7.6 06/20/2024   BILITOT 0.4 06/20/2024   ALT 24 06/20/2024   AST 26 06/20/2024   ALKPHOS 51 06/20/2024    Other Imaging: US  FETAL BPP WO NON STRESS Result Date: 12/22/2024 CLINICAL DATA:  Limited prenatal care. Medication exposure during the 1st trimester of pregnancy. Thirty-three weeks and 4 days pregnant by last menstrual period and 36 weeks and 4 days pregnant by initial ultrasound. EXAM: BIOPHYSICAL PROFILE FINDINGS: Number of Fetuses: 1 Heart Rate: 147 bpm Presentation: Cephalic Movement: 2 time: 5 minutes Breathing: 2 Tone: The patient refused the remainder of the examination, including fetal tone and amniotic fluid. Amniotic Fluid: The patient refused the remainder of the examination, including fetal tone and amniotic fluid. Total Score: 4/4. The patient refused the remainder of the examination, including fetal tone and amniotic fluid. IMPRESSION: Incomplete examination due to patient refusal to finish the examination. A single fetus was demonstrated in a cephalic presentation with a fetal heart rate of 147, fetal movement score of 2 in 5 minutes and fetal breathing score of 2. Electronically Signed   By: Elspeth Bathe M.D.   On: 12/22/2024 18:22     Assessment / Plan:   LAPRECIOUS AUSTILL is a 29 y.o. [id number] who presents with late third trimester altered mental status and paranoia.   1. Reassuring FHTs today with no signs of labor 2. Awaiting transfer to [facility] when bed is available.  3.Attempt to get growth US  and complete BPP today. Spoke to US  about patient and the limitation that they may face.   Thank you for the opportunity to be involved with this pt's care.       [1]  No current facility-administered medications on file prior to encounter.   Current Outpatient Medications on File Prior to Encounter  Medication Sig Dispense Refill   INVEGA  SUSTENNA 156 MG/ML SUSY  injection Inject 156 mg into the muscle every 30 (thirty) days. (Patient not taking: Reported on 06/20/2024)     nicotine  (NICODERM CQ  - DOSED IN MG/24 HR) 7 mg/24hr patch Place 1 patch (7 mg total) onto the skin daily as needed (as needed). (Patient not taking: Reported on 06/20/2024) 28 patch 0   nicotine  polacrilex (NICORETTE ) 2 MG gum Take 1 each (2 mg total) by mouth as needed for smoking cessation. (Patient not taking: Reported on 06/20/2024) 100 tablet 0   paliperidone  (INVEGA ) 6 MG 24 hr tablet Take 1 tablet (6 mg total) by mouth daily. (Patient not taking: Reported on 06/20/2024)     QUEtiapine  (SEROQUEL ) 50 MG tablet Take 50 mg by mouth at bedtime.    [2] No Known Allergies  "

## 2024-12-24 NOTE — Group Note (Signed)
 Date:  12/24/2024 Time:  8:42 PM  Group Topic/Focus:  Wrap-Up Group:   The focus of this group is to help patients review their daily goal of treatment and discuss progress on daily workbooks.    Participation Level:  Did Not Attend  Participation Quality:  none  Affect:  none  Cognitive:  none  Insight: None  Engagement in Group:  none  Modes of Intervention:  none  Additional Comments:    Ginny JONETTA Galeazzi 12/24/2024, 8:42 PM
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
## 2024-12-25 DIAGNOSIS — F200 Paranoid schizophrenia: Secondary | ICD-10-CM | POA: Diagnosis not present

## 2024-12-25 NOTE — Progress Notes (Signed)
 Spoke with perinatologist Dr. William regarding pt's pregnancy and he also recommends growth US , which patient has refused thus far. No indication for early delivery or induction at this time; also recommended exploring options through administration on obtaining surrogacy of the fetus to allow us  to act in the fetus' best interest.   Attempted hospital transfer to [facility] service, they are unable to accept patient on labor & delivery, pt's pregnancy does not require higher level of care at this time. The OBGYN service did state they were made aware to possibly be expecting her in the near future, but that her transfer would need to be to the perinatal psychiatric unit. Able to look up pt's prior transfer request, which was documented in their system 12/22/24 and had expired. Unsure of pt being on a waiting list or not, assisted me in renewing the transfer request to perinatal psych. Confirmed they had facesheet and documentation already in system and did not need to resend.

## 2024-12-25 NOTE — Group Note (Signed)
 [facility] LCSW Group Therapy Note   Group Date: 12/25/2024 Start Time: 1300 End Time: 1400   Type of Therapy/Topic:  Group Therapy:  Balance in Life  Participation Level:  None   Description of Group:    This group will address the concept of balance and how it feels and looks when one is unbalanced. Patients will be encouraged to process areas in their lives that are out of balance, and identify reasons for remaining unbalanced. Facilitators will guide patients utilizing problem- solving interventions to address and correct the stressor making their life unbalanced. Understanding and applying boundaries will be explored and addressed for obtaining  and maintaining a balanced life. Patients will be encouraged to explore ways to assertively make their unbalanced needs known to significant others in their lives, using other group members and facilitator for support and feedback.  Therapeutic Goals: Patient will identify two or more emotions or situations they have that consume much of in their lives. Patient will identify signs/triggers that life has become out of balance:  Patient will identify two ways to set boundaries in order to achieve balance in their lives:  Patient will demonstrate ability to communicate their needs through discussion and/or role plays  Summary of Patient Progress: Patient attended group.  Patient was disruptive throughout group. Patient was observed to be responding to internal stimuli, she was often see gesturing and speaking though not to anyone specifically.  Patient stated that she needed to share something in group and stated Only Black people know love and it's not possible for White people to understand.  Insight is poor.  Therapeutic Modalities:   Cognitive Behavioral Therapy Solution-Focused Therapy Assertiveness Training   Sherryle JINNY Margo, LCSW
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
## 2024-12-25 NOTE — Progress Notes (Signed)
 Patient is very intrusive and agitated getting in other Patients face counting Patient stating I am going to kill all of you Requires continues redirection. Pt will not allow certain Peers to sit next to her if they are not her race. Staff continues to redirect Pt. To keep Patient and Peers safe. Provider notified.

## 2024-12-25 NOTE — Progress Notes (Signed)
 Pt presents disorganized and preoccupied. Pt minimal with this clinical research associate. Pt compliant with medication administration per MD orders. Pt given education, support, and encouragement to be active in her treatment plan. Pt being monitored Q 15 minutes for safety per unit protocol, remains safe on the unit

## 2024-12-25 NOTE — Plan of Care (Signed)
" °  Problem: Education: Goal: Emotional status will improve 12/25/2024 1226 by Cyrus Law, RN Outcome: Not Progressing 12/25/2024 1226 by Cyrus Law, RN Outcome: Progressing Goal: Mental status will improve 12/25/2024 1226 by Cyrus Law, RN Outcome: Not Progressing 12/25/2024 1226 by Cyrus Law, RN Outcome: Progressing Goal: Verbalization of understanding the information provided will improve Outcome: Not Progressing   "

## 2024-12-25 NOTE — Group Note (Signed)
 Recreation Therapy Group Note   Group Topic:Health and Wellness  Group Date: 12/25/2024 Start Time: 1525 End Time: 1605 Facilitators: Celestia Jeoffrey BRAVO, LRT, CTRS Location: Courtyard  Group Description: [employer]. LRT and patients played games of basketball, drew with chalk, and played corn hole while outside in the courtyard while getting fresh air and sunlight. Music was being played in the background. LRT and peers conversed about different games they have played before, what they do in their free time and anything else that is on their minds. LRT encouraged pts to drink water after being outside, sweating and getting their heart rate up.  Goal Area(s) Addressed: Patient will build on frustration tolerance skills. Patients will partake in a competitive play game with peers. Patients will gain knowledge of new leisure interest/hobby.    Affect/Mood: N/A   Participation Level: Did not attend    Clinical Observations/Individualized Feedback: Patient did not attend.  Plan: Continue to engage patient in RT group sessions 2-3x/week.   Jeoffrey BRAVO Celestia, LRT, CTRS 12/25/2024 4:57 PM

## 2024-12-25 NOTE — Group Note (Signed)
 Recreation Therapy Group Note   Group Topic:Leisure Education  Group Date: 12/25/2024 Start Time: 1000 End Time: 1110 Facilitators: Celestia Jeoffrey BRAVO, LRT, CTRS Location: Craft Room  Group Description: Leisure. Patients were given the option to choose from journaling, coloring, drawing, making origami, playing with playdoh, listening to music or singing karaoke. LRT and pts discussed the meaning of leisure, the importance of participating in leisure during their free time/when they're outside of the hospital, as well as how our leisure interests can also serve as coping skills.   Goal Area(s) Addressed:  Patient will identify a current leisure interest.  Patient will learn the definition of leisure. Patient will practice making a positive decision. Patient will have the opportunity to try a new leisure activity. Patient will communicate with peers and LRT.    Affect/Mood: Flat   Participation Level: Minimal   Behavior: Bizarre    Clinical Observations/Individualized Feedback: Eileen Moody was present duration of session. Pt shared that she likes to write. I have 3 books and I am rich. Pt was noted to be talking to herself and laughing at inappropriate times. Pt was able and willing to have a conversation with LRT about her name and things she does in her free time, which is a change from previous interactions.   Plan: Continue to engage patient in RT group sessions 2-3x/week.   Jeoffrey BRAVO Celestia, LRT, CTRS 12/25/2024 11:22 AM

## 2024-12-25 NOTE — Progress Notes (Addendum)
 Went to see patient accompanied by RN D.Sickles for daily assessment of fetal heart tones.   Patient agreeable to visit and pleasant throughout. She continues to maintain there are 4 babies and has names for each of them. She admits positive fetal movement and denies leakage of fluid, vaginal bleeding, contractions, headaches, visual changes.   BP (!) 142/79 (BP Location: Left Arm)   Pulse (!) 131   Temp 98.4 F (36.9 C) (Oral)   Resp 18   Ht 5' 5 (1.651 m)   Wt 74.8 kg   LMP 05/01/2024 (Approximate)   SpO2 99%   BMI 27.46 kg/m   Fetal heart tones: 140s   Slater Rains, CNM [facility] 12/25/2024 3:43 PM

## 2024-12-25 NOTE — Progress Notes (Signed)
 Patient agitated threatening other Staff. Patient unable to be redirected by Staff. Pacing and very anxious.  I need to go to my home here in [place] I am carrying Trump grandchild I am a Princes no one should sit on my chair Patient responding to internal stimuli. Prn Haldol  5 mg PO given Patient is compliant. Support provided.
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
## 2024-12-25 NOTE — Plan of Care (Signed)
   Problem: Education: Goal: Emotional status will improve Outcome: Progressing Goal: Mental status will improve Outcome: Progressing

## 2024-12-25 NOTE — Progress Notes (Signed)
 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 Patient not agreeable. Support provided.

## 2024-12-25 NOTE — Group Note (Signed)
 Date:  12/25/2024 Time:  8:47 PM  Group Topic/Focus:  Dimensions of Wellness:   The focus of this group is to introduce the topic of wellness and discuss the role each dimension of wellness plays in total health. Self Care:   The focus of this group is to help patients understand the importance of self-care in order to improve or restore emotional, physical, spiritual, interpersonal, and financial health.    Pt did not attend group.  Whitleigh Garramone L 12/25/2024, 8:47 PM

## 2024-12-26 ENCOUNTER — Other Ambulatory Visit: Payer: Self-pay | Admitting: Obstetrics

## 2024-12-26 LAB — LIPID PANEL
Cholesterol: 220 mg/dL — ABNORMAL HIGH (ref 0–200)
HDL: 80 mg/dL
LDL Cholesterol: 104 mg/dL — ABNORMAL HIGH (ref 0–99)
Total CHOL/HDL Ratio: 2.8 ratio
Triglycerides: 181 mg/dL — ABNORMAL HIGH
VLDL: 36 mg/dL (ref 0–40)

## 2024-12-26 LAB — HEMOGLOBIN A1C
Hgb A1c MFr Bld: 4.7 % — ABNORMAL LOW (ref 4.8–5.6)
Mean Plasma Glucose: 88.19 mg/dL

## 2024-12-26 LAB — RUPTURE OF MEMBRANE (ROM)PLUS
Rom Plus: NEGATIVE
Rom Plus: NEGATIVE

## 2024-12-26 LAB — CHLAMYDIA/NGC RT PCR (ARMC ONLY)
Chlamydia Tr: NOT DETECTED
N gonorrhoeae: NOT DETECTED

## 2024-12-26 NOTE — Group Note (Signed)
 Date:  12/26/2024 Time:  8:59 PM  Group Topic/Focus:  Conflict Resolution:   The focus of this group is to discuss the conflict resolution process and how it may be used upon discharge.    Pt did not attend group.  Unique Searfoss L 12/26/2024, 8:59 PM

## 2024-12-26 NOTE — Progress Notes (Signed)
" °   12/26/24 0830  Psych Admission Type (Psych Patients Only)  Admission Status Involuntary  Psychosocial Assessment  Patient Complaints None  Eye Contact Watchful  Facial Expression Anxious;Fixed smile  Affect Preoccupied  Speech Soft  Interaction Demanding  Motor Activity Restless  Appearance/Hygiene In scrubs  Behavior Characteristics Pacing;Irritable;Cooperative  Mood Labile;Preoccupied  Thought Process  Coherency Disorganized  Content Delusions;Preoccupation  Delusions Paranoid;Persecutory  Perception Hallucinations;Derealization  Hallucination Auditory  Judgment Poor  Confusion Mild  Danger to Self  Current suicidal ideation? Denies  Agreement Not to Harm Self Yes  Description of Agreement verbal  Danger to Others  Danger to Others None reported or observed  Danger to Others Abnormal  Destructive Behavior No threats or harm toward property   D: Patient is alert and oriented. She denies anxiety or depression at this time; however, she presents with intermittent irritability and anxiety. Patient denies pain. She denies SI/HI and AVH; however, she exhibits disorganized thoughts, delusional content, and appears to be responding to internal stimuli. Patient was seen and evaluated by the midwife this shift and is currently [redacted] weeks pregnant. Patient remains on 1:1 observation with occasional staff redirection due to agitating other patients in the milieu.  A: Scheduled medications administered per MD orders. Haldol  5 mg PO PRN offered for psychosis, irritability, and agitation. Support and encouragement provided. Frequent verbal contact maintained. Routine safety checks conducted. Patient remains on 1:1 observation with assigned staff present at all times.  R: No adverse medication reactions noted. Decrease in irritability and agitation observed following administration of PRN Haldol . Patient is compliant with medications. Patient interacts inappropriately with peers/staff at times.  Patient remains safe at this time. Plan of care is ongoing. "

## 2024-12-26 NOTE — Plan of Care (Signed)
  Problem: Education: Goal: Emotional status will improve Outcome: Progressing   Problem: Physical Regulation: Goal: Ability to maintain clinical measurements within normal limits will improve Outcome: Progressing   Problem: Safety: Goal: Periods of time without injury will increase Outcome: Progressing

## 2024-12-26 NOTE — Progress Notes (Signed)
 Pt claims she is having contractions and that her water broke. L&D was called by this clinical research associate and and nurses came to check on her. A sample was sent to lab. Will continue to monitor the patient. Pt remains safe on the unit

## 2024-12-26 NOTE — Group Note (Signed)
 Date:  12/26/2024 Time:  9:55 AM  Group Topic/Focus:  Goals Group:   The focus of this group is to help patients establish daily goals to achieve during treatment and discuss how the patient can incorporate goal setting into their daily lives to aide in recovery.    Participation Level:  Active  Participation Quality:  Appropriate  Affect:  Appropriate  Cognitive:  Alert  Insight: Appropriate  Engagement in Group:  Engaged  Modes of Intervention:  Discussion  Additional Comments:    Eileen Moody June 12/26/2024, 9:55 AM

## 2024-12-26 NOTE — Progress Notes (Signed)
 Visited patient with Eileen Alvine, RN for daily FHT assessment. Eileen Moody agreed to allow me to accompany her and the RN into the exam room. She reports leaking of fluid that began overnight. She says that has continued. A ROM Plus was collected overnight and that was negative. She allowed me to look at the pad she was wearing and had been on for 5+ hrs. There was a small amount of off-white discharge on the pad. She reports +FM and some contractions, but has been able to participate in unit activities, sleep, converse. She denies headache, epigastric pain or visual changes.   BP 125/77 (BP Location: Left Arm)   Pulse 96   Temp 98.6 F (37 C) (Oral)   Resp 18   Ht 5' 5 (1.651 m)   Wt 74.8 kg   LMP 05/01/2024 (Approximate)   SpO2 99%   BMI 27.46 kg/m  FHT 135  Asked RN and MHT to please notify OB team if patient is ever saturating a pad or if she seems to be visibly uncomfortable with contractions.   -Lauraine Lakes, CNM

## 2024-12-26 NOTE — Progress Notes (Signed)
 Labor & Delivery Plan for Care  If pt needs OB triage evaluation (labor check, possible ROM, decreased FM, etc):  -Keep in BMU and do any workup down there -Take NST to her, RN or CNM will need to stay & monitor  Once L&D admission determined: -Call security for escort/transport -1:1 sitter to come to L&D -Immediately call attending to complete the 2-physician attestation for medical decision making -Release admit orders from  'signed & held' -Psych consult and PRN Haldol  orders are entered -- call pharmacy and ask meds to be sent up  -Notify psych of admission -- if needing beyond ordered Haldol , consult with psych -Notify anesthesia of admission -If needing restraints, will need to order and have physician attest within 1 hour of the order  Postpartum:  -Plan for early discharge back to BMU once stable from OB standpoint -AOB will continue postpartum rounds on pt in BMU  Awaiting emergency guardianship to pt's mother. If granted, will update plan of care.   Patient's two elevated blood pressures noted: -12/24/24 at 1612: 133/91 -12/25/24 at 0841: 142/79 Due to possible elevation from pt's psychiatric state and agitation, will obtain [facility] labs and consult with MFM.   Per CNM Lauraine Lakes, on 12/26/24, pt reported desire for epidural during labor and is planning to breast and formula feed.    Estil Mangle, DO [facility]

## 2024-12-26 NOTE — Progress Notes (Addendum)
 I evaluated the patient along with Acie Harold, RN on the Behavioral Health Unit. We were asked to evaluate the patient for possible rupture of membranes. She has been reporting increased vaginal discharge and thinks her water broke early this morning after her shower. She had a ROM Plus that confirmed she was NOT ruptured this morning.   I have looked at some of her maxi pads both this morning and this evening, and they have had small amounts of discharge vs concentrated urine on them (drops). The most recent pad was nitrazine negative. She has not been filling pads with discharge or amniotic fluid.   Another ROM Plus was collected as well as Gc/Ct swab this evening. Pt consented to cervical exam, which was performed by [facility] in my presence. She was 1cm dilated, which may be patient's baseline. FHT strong in 140's.   Pt believes she is in labor. She was conversational during our evaluation. Did not have to stop to breathe with contractions, grimace or vocalize with any contractions. I affirmed that I believe she is having some contractions, but it's not time yet to give birth. I affirmed that we will help her to have a healthy birth, and that we are happy to come and evaluate her at any time to see if it's time to come to labor and delivery. She seemed to find this explanation acceptable.   -Lauraine Lakes, CNM  Addendum: ROM Plus is NEGATIVE

## 2024-12-26 NOTE — Plan of Care (Signed)
   Problem: Education: Goal: Emotional status will improve Outcome: Not Progressing Goal: Mental status will improve Outcome: Not Progressing

## 2024-12-27 DIAGNOSIS — F200 Paranoid schizophrenia: Secondary | ICD-10-CM | POA: Diagnosis not present

## 2024-12-27 LAB — CBC
HCT: 35.1 % — ABNORMAL LOW (ref 36.0–46.0)
Hemoglobin: 12.2 g/dL (ref 12.0–15.0)
MCH: 31.4 pg (ref 26.0–34.0)
MCHC: 34.8 g/dL (ref 30.0–36.0)
MCV: 90.5 fL (ref 80.0–100.0)
Platelets: 200 K/uL (ref 150–400)
RBC: 3.88 MIL/uL (ref 3.87–5.11)
RDW: 13.1 % (ref 11.5–15.5)
WBC: 9.4 K/uL (ref 4.0–10.5)
nRBC: 0 % (ref 0.0–0.2)

## 2024-12-27 LAB — PROTEIN / CREATININE RATIO, URINE
Creatinine, Urine: 97 mg/dL
Protein Creatinine Ratio: 0.1 mg/mg
Total Protein, Urine: 13 mg/dL

## 2024-12-27 LAB — COMPREHENSIVE METABOLIC PANEL WITH GFR
ALT: 21 U/L (ref 0–44)
AST: 22 U/L (ref 15–41)
Albumin: 3.2 g/dL — ABNORMAL LOW (ref 3.5–5.0)
Alkaline Phosphatase: 111 U/L (ref 38–126)
Anion gap: 10 (ref 5–15)
BUN: 8 mg/dL (ref 6–20)
CO2: 21 mmol/L — ABNORMAL LOW (ref 22–32)
Calcium: 9.1 mg/dL (ref 8.9–10.3)
Chloride: 105 mmol/L (ref 98–111)
Creatinine, Ser: 0.69 mg/dL (ref 0.44–1.00)
GFR, Estimated: >60 mL/min
Glucose, Bld: 74 mg/dL (ref 70–99)
Potassium: 4.2 mmol/L (ref 3.5–5.1)
Sodium: 136 mmol/L (ref 135–145)
Total Bilirubin: 0.3 mg/dL (ref 0.0–1.2)
Total Protein: 6.6 g/dL (ref 6.5–8.1)
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
## 2024-12-27 NOTE — Group Note (Signed)
 Date:  12/27/2024 Time:  11:33 PM  Group Topic/Focus:  Personal Choices and Values:   The focus of this group is to help patients assess and explore the importance of values in their lives, how their values affect their decisions, how they express their values and what opposes their expression.    Pt did not attend group.  Sabrea Sankey L 12/27/2024, 11:33 PM

## 2024-12-27 NOTE — Group Note (Signed)
 Date:  12/27/2024 Time:  6:52 PM  Group Topic/Focus:  Wellness Toolbox:   The focus of this group is to discuss various aspects of wellness, balancing those aspects and exploring ways to increase the ability to experience wellness.  Patients will create a wellness toolbox for use upon discharge.    Participation Level:  Did Not Attend  Eileen Moody 12/27/2024, 6:52 PM

## 2024-12-27 NOTE — Progress Notes (Signed)
" °   12/27/24 0640  Psych Admission Type (Psych Patients Only)  Admission Status Involuntary  Psychosocial Assessment  Patient Complaints None  Eye Contact Brief  Facial Expression Flat;Blank  Affect Anxious;Preoccupied  Speech Logical/coherent;Soft  Interaction Demanding;Isolative  Motor Activity Restless  Appearance/Hygiene Unremarkable  Behavior Characteristics Cooperative;Appropriate to situation  Mood Labile  Thought Process  Coherency Disorganized  Content Blaming others  Delusions WDL  Perception WDL  Hallucination None reported or observed  Judgment WDL  Confusion WDL  Danger to Self  Current suicidal ideation? Denies  Agreement Not to Harm Self Yes  Description of Agreement Verbal  Danger to Others  Danger to Others None reported or observed  Danger to Others Abnormal  Destructive Behavior No threats or harm toward property   No distress noted on 1:1 denies SI/HI/AVH interacting appropriately with peers and staff, patient's thoughts ar organized and coherent 15 minutes safety checks monitored.  "
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
## 2024-12-27 NOTE — Group Note (Signed)
 Date:  12/27/2024 Time:  6:48 PM  Group Topic/Focus:  Conflict Resolution:   The focus of this group is to discuss the conflict resolution process and how it may be used upon discharge.    Participation Level:  Did Not Attend   Eileen Moody [name] 12/27/2024, 6:48 PM

## 2024-12-27 NOTE — Progress Notes (Signed)
" °   12/27/24 0805  Psych Admission Type (Psych Patients Only)  Admission Status Involuntary  Psychosocial Assessment  Patient Complaints None  Eye Contact Brief  Facial Expression Flat;Blank  Affect Anxious;Preoccupied  Speech Soft  Interaction Guarded  Motor Activity Slow  Appearance/Hygiene Revealing clothes/seductive clothing  Behavior Characteristics Cooperative;Appropriate to situation  Mood Anxious;Suspicious  Thought Process  Coherency Disorganized  Content Blaming others  Delusions WDL  Perception WDL  Hallucination None reported or observed  Judgment WDL  Confusion WDL  Danger to Self  Current suicidal ideation? Denies  Agreement Not to Harm Self Yes  Description of Agreement verbal  Danger to Others  Danger to Others None reported or observed  Danger to Others Abnormal  Destructive Behavior No threats or harm toward property    "

## 2024-12-27 NOTE — Progress Notes (Signed)
 Subjective: Patient reports feeling contractions since she woke up from her nap this afternoon the pain is 4/10. She states her water has been broken for 2 days. She has some heartburn and nausea after eating a cheeseburger  Objective: I have reviewed patient's vital signs. BP 129/87 (BP Location: Left Arm)   Pulse 86   Temp 98 F (36.7 C) (Oral)   Resp 16   Ht 5' 5 (1.651 m)   Wt 74.8 kg   LMP 05/01/2024 (Approximate)   SpO2 100%   BMI 27.46 kg/m   General: alert and cooperative Resp: breathing without difficulty  SVE 1-2/thick/high, vertex  Perineum dry, peri pad dry   EFM: baseline 145, moderate variability, pos accel, neg decel  TOCO: rare contractions.   Assessment/Plan: [id number] at [redacted]w[redacted]d   -I evaluated the pt with Candance Motto RN, the pt was calm throughout the NST she remained in a reclined relaxed position for the duration of the NST, she may have had a slight grimace at the time the contraction was noted on the tracing,  Her perineum was noted to be dry, there was not fluid noted on exam. Discussed with pt it is not time to give birth as the cervix needs to be more open.       LOS: 7 days    JINNIE HERO [facility], CNM 12/27/2024, 2:57 PM
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
## 2024-12-27 NOTE — Plan of Care (Signed)
  Problem: Education: Goal: Emotional status will improve Outcome: Progressing Goal: Mental status will improve Outcome: Progressing   Problem: Coping: Goal: Ability to verbalize frustrations and anger appropriately will improve Outcome: Progressing   Problem: Physical Regulation: Goal: Ability to maintain clinical measurements within normal limits will improve Outcome: Progressing   Problem: Safety: Goal: Periods of time without injury will increase Outcome: Progressing

## 2024-12-28 NOTE — Group Note (Signed)
 Date:  12/28/2024 Time:  8:43 PM  Group Topic/Focus:  Managing Feelings:   The focus of this group is to identify what feelings patients have difficulty handling and develop a plan to handle them in a healthier way upon discharge. Self Care:   The focus of this group is to help patients understand the importance of self-care in order to improve or restore emotional, physical, spiritual, interpersonal, and financial health. Wrap-Up Group:   The focus of this group is to help patients review their daily goal of treatment and discuss progress on daily workbooks.    Participation Level:  Minimal  Participation Quality:  Inattentive  Affect:  Flat  Cognitive:  Disorganized  Insight: None  Engagement in Group:  Distracting and Resistant  Modes of Intervention:  Discussion and Support  Additional Comments:  N/A  Butler LITTIE Gelineau 12/28/2024, 8:43 PM

## 2024-12-28 NOTE — Plan of Care (Signed)
" °  Problem: Education: Goal: Emotional status will improve Outcome: Not Progressing   Problem: Education: Goal: Knowledge of [facility] Education information/materials will improve Outcome: Progressing   Problem: Education: Goal: Mental status will improve Outcome: Not Progressing   "

## 2024-12-28 NOTE — Progress Notes (Signed)
" °   12/27/24 2000  Psych Admission Type (Psych Patients Only)  Admission Status Involuntary  Psychosocial Assessment  Patient Complaints None  Eye Contact Brief  Facial Expression Flat  Affect Anxious;Preoccupied  Speech Logical/coherent;Horticulturist, Commercial;Hostile  Motor Activity Restless  Appearance/Hygiene Unremarkable  Behavior Characteristics Cooperative;Appropriate to situation  Mood Anxious  Thought Process  Coherency Disorganized  Content Blaming others  Delusions WDL  Perception WDL  Hallucination None reported or observed  Judgment WDL  Confusion WDL  Danger to Self  Current suicidal ideation? Denies  Agreement Not to Harm Self Yes  Description of Agreement Verbal  Danger to Others  Danger to Others None reported or observed  Danger to Others Abnormal  Destructive Behavior No threats or harm toward property   Patient alert and oriented x 3, with periods of confusion to situation affect is blunted, she appears agitated and was verbally aggressive to staff. Patient was redirected away from using profanity and verbal aggression. Patient is on 1:1 for safety and she is monitored closely. 15 minutes safety checks maintained will continue to monitor.  "

## 2024-12-28 NOTE — Progress Notes (Addendum)
 Visited patient this evening on Behavioral Health Unit. Danielle, RN was able to accompany me. This evening, Eileen Moody is expressing her frustration that I have not admitted her to labor and delivery as she believes her water has been broken for three days, she's been in labor and we're not providing appropriate care. She told me that I was fired, but that Danielle could deliver her baby. She allowed Danielle to doppler FHT in the exam room with the MHT. She did not permit me to enter, but I was also able to hear FHT through the door. Although she is angry with me, she is able to ambulate and talk. She does not pause to breathe through contractions, vocalize, or otherwise demonstrate observable labor signs. She is 1:1 with MHT, and they do not report any saturated maxi pads.   FHT 150's. Abdomen soft, Nontender, gravid per RN report.  She had a mild range blood pressure this afternoon, which was normal on repeat. Highly suspect this is related to patient's emotional state at the time, rather than to a hypertensive disorder of pregnancy. Prex labs on 1/10 were WNL.  -Lauraine Lakes, CNM

## 2024-12-28 NOTE — Plan of Care (Signed)
  Problem: Education: Goal: Emotional status will improve Outcome: Progressing Goal: Mental status will improve Outcome: Progressing   Problem: Coping: Goal: Ability to verbalize frustrations and anger appropriately will improve Outcome: Progressing   Problem: Safety: Goal: Periods of time without injury will increase Outcome: Progressing
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
## 2024-12-28 NOTE — Group Note (Signed)
 Date:  12/28/2024 Time:  12:00 PM  Group Topic/Focus:  Healthy Communication:   The focus of this group is to discuss communication, barriers to communication, as well as healthy ways to communicate with others.    Participation Level:  Active  Participation Quality:  Appropriate  Affect:  Appropriate  Cognitive:  Appropriate  Insight: Appropriate  Engagement in Group:  Engaged  Modes of Intervention:  Activity  Additional Comments:    Camellia HERO Yasmen Cortner 12/28/2024, 12:00 PM

## 2024-12-28 NOTE — Progress Notes (Signed)
" °   12/28/24 0825  Psych Admission Type (Psych Patients Only)  Admission Status Other (Comment)  Psychosocial Assessment  Patient Complaints None  Eye Contact Brief  Facial Expression Flat;Blank  Affect Anxious;Preoccupied  Speech Soft  Interaction Guarded  Motor Activity Slow  Appearance/Hygiene Unremarkable  Behavior Characteristics Cooperative;Appropriate to situation  Mood Anxious  Thought Process  Coherency Disorganized  Content Blaming others  Delusions WDL  Perception WDL  Hallucination None reported or observed  Judgment WDL  Confusion WDL  Danger to Self  Current suicidal ideation? Denies  Agreement Not to Harm Self Yes  Description of Agreement verbal  Danger to Others  Danger to Others None reported or observed  Danger to Others Abnormal  Destructive Behavior No threats or harm toward property    "

## 2024-12-29 MED ORDER — QUETIAPINE FUMARATE 100 MG PO TABS
100.0000 mg | ORAL_TABLET | Freq: Every morning | ORAL | Status: DC
  Administered 2024-12-30: 100 mg via ORAL
  Filled 2024-12-29: qty 1

## 2024-12-29 NOTE — Progress Notes (Signed)
" °   12/28/24 2000  Psych Admission Type (Psych Patients Only)  Admission Status Involuntary  Psychosocial Assessment  Patient Complaints None  Eye Contact Brief  Facial Expression Flat  Affect Anxious;Preoccupied  Speech Logical/coherent;Horticulturist, Commercial;Hostile  Motor Activity Restless  Appearance/Hygiene Unremarkable  Behavior Characteristics Cooperative;Appropriate to situation  Mood Anxious  Thought Process  Coherency Disorganized  Content Blaming others  Delusions WDL  Perception WDL  Hallucination None reported or observed  Judgment WDL  Confusion WDL  Danger to Self  Current suicidal ideation? Denies  Agreement Not to Harm Self Yes  Description of Agreement Verbal  Danger to Others  Danger to Others None reported or observed  Danger to Others Abnormal  Destructive Behavior No threats or harm toward property   Patient denies SI/HI/AVH, her thoughts are disorganized and tangential, she is noted argumentative and irritable, yelling and using profanities towards staff and patients. Patient was frequently redirected for safety. Patient is on a 1:1 for safety while awake .  "

## 2024-12-29 NOTE — Group Note (Signed)
 Recreation Therapy Group Note   Group Topic:Healthy Support Systems  Group Date: 12/29/2024 Start Time: 1015 End Time: 1110 Facilitators: Celestia Jeoffrey BRAVO, LRT, CTRS Location: Craft Room  Group Description: Straw Bridge. In groups or individually, patients were given 10 plastic drinking straws and an equal length of masking tape. Using the materials provided, patients were instructed to build a free-standing bridge-like structure to suspend an everyday item (ex: deck of cards) off the floor or table surface. All materials were required to be used in secondary school teacher. LRT facilitated post-activity discussion reviewing the importance of having strong and healthy support systems in our lives. LRT discussed how the people in our lives serve as the tape and the deck of cards we placed on top of our straw structure are the stressors we face in daily life. LRT and pts discussed what happens in our life when things get too heavy for us , and we don't have strong supports outside of the hospital. Pt shared 2 of their healthy supports in their life aloud in the group.   Goal Area(s) Addressed:  Patient will identify 2 healthy supports in their life. Patient will identify skills to successfully complete activity. Patient will identify correlation of this activity to life post-discharge.  Patient will build on frustration tolerance skills. Patient will increase team building and communication skills.    Affect/Mood: N/A   Participation Level: Did not attend    Clinical Observations/Individualized Feedback: Patient did not attend.  Plan: Continue to engage patient in RT group sessions 2-3x/week.   Jeoffrey BRAVO Celestia, LRT, CTRS 12/29/2024 11:43 AM

## 2024-12-29 NOTE — Group Note (Signed)
 Date:  12/29/2024 Time:  9:59 PM  Group Topic/Focus:  Coping With Mental Health Crisis:   The purpose of this group is to help patients identify strategies for coping with mental health crisis.  Group discusses possible causes of crisis and ways to manage them effectively. Wrap-Up Group:   The focus of this group is to help patients review their daily goal of treatment and discuss progress on daily workbooks.    Participation Level:  Minimal  Participation Quality:  Appropriate and Attentive  Affect:  Appropriate  Cognitive:  Disorganized  Insight: Appropriate and Improving  Engagement in Group:  Improving  Modes of Intervention:  Education  Additional Comments:     Arlester CHRISTELLA Servant 12/29/2024, 9:59 PM

## 2024-12-29 NOTE — Progress Notes (Addendum)
 Able to see patient on the behavioral health unit with Acie, RN, Laura G, SNM and with Consuello, RN as chaperone. Cataleya wa pleasant and talkative throughout assessment. She agreed to an NST with was reactive with a baseline of 145. Her strip showed no uterine contraction and she did not appear to have any contractions during our 30 minute assessment. Her pad had scant discharge with no signs of leaking of fluid. She requested a cervical exam as she believes she has been in labor for 4 days. Spendlove, RN performed a vaginal exam and her cervix remain unchanged from previous exams. She is 1.5cm. She felt as though Acie was wrong about her cervix not changing but we were able to redirect the conversation.  Reviewed with staff signs that she may be in labor that are nonverbal. We discussed how her pad would look if her water was broken.  Plan of care regarding labor written out in 1/9 by Dr. Leigh.  Please see consult note from today from MFM regarding POC for possible gestation hypertension.

## 2024-12-29 NOTE — Plan of Care (Signed)
  Problem: Education: Goal: Emotional status will improve Outcome: Progressing   Problem: Education: Goal: Mental status will improve Outcome: Progressing   Problem: Education: Goal: Verbalization of understanding the information provided will improve Outcome: Progressing   

## 2024-12-29 NOTE — Progress Notes (Addendum)
" °   12/29/24 1300  Psych Admission Type (Psych Patients Only)  Admission Status Involuntary  Psychosocial Assessment  Patient Complaints Irritability;Restlessness;Anxiety;Sleep disturbance (patient started out the day very angry, anxious and irrittable; but after some rest, she has calmned down some.)  Eye Contact Staring;Watchful  Facial Expression Angry  Affect Anxious;Preoccupied (patient was fixated on being in labor.)  Speech Aggressive;Loud (at times)  Interaction Demanding;Hostile;Intrusive;Hypervigilant (patient is now saying she's pregnant with 2 girls and 2 boys)  Motor Activity Restless;Slow;Pacing (at times throughout the day)  Appearance/Hygiene Layered clothes;In scrubs;Improved  Behavior Characteristics Agressive verbally;Agitated;Anxious;Intrusive;Pacing;Irritable;Restless  Mood Preoccupied;Irritable;Labile  Aggressive Behavior  Effect No apparent injury  Thought Process  Coherency Disorganized  Content Blaming others;Preoccupation  Delusions Grandeur (patient stated that Nancyann Frohlich is the grandfather of one of her babies; also that Vladamir Putin is also apart of the family)  Perception Depersonalization;Derealization  Hallucination Auditory (although denies, she is internally preoccupied)  Judgment Poor  Confusion Moderate  Danger to Self  Current suicidal ideation? Denies  Agreement Not to Harm Self Yes  Description of Agreement Verbal  Danger to Others  Danger to Others None reported or observed  Danger to Others Abnormal  Destructive Behavior No threats or harm toward property    "

## 2024-12-29 NOTE — Plan of Care (Signed)
  Problem: Education: Goal: Knowledge of [facility] Education information/materials will improve Outcome: Not Progressing Goal: Emotional status will improve Outcome: Not Progressing Goal: Mental status will improve Outcome: Not Progressing Goal: Verbalization of understanding the information provided will improve Outcome: Not Progressing   Problem: Activity: Goal: Interest or engagement in activities will improve Outcome: Not Progressing Goal: Sleeping patterns will improve Outcome: Not Progressing   Problem: Coping: Goal: Ability to verbalize frustrations and anger appropriately will improve Outcome: Not Progressing Goal: Ability to demonstrate self-control will improve Outcome: Not Progressing   Problem: Health Behavior/Discharge Planning: Goal: Identification of resources available to assist in meeting health care needs will improve Outcome: Not Progressing Goal: Compliance with treatment plan for underlying cause of condition will improve Outcome: Not Progressing   Problem: Physical Regulation: Goal: Ability to maintain clinical measurements within normal limits will improve Outcome: Not Progressing   Problem: Safety: Goal: Periods of time without injury will increase Outcome: Not Progressing   Problem: Education: Goal: Knowledge of General Education information will improve Description: Including pain rating scale, medication(s)/side effects and non-pharmacologic comfort measures Outcome: Not Progressing   Problem: Health Behavior/Discharge Planning: Goal: Ability to manage health-related needs will improve Outcome: Not Progressing   Problem: Clinical Measurements: Goal: Ability to maintain clinical measurements within normal limits will improve Outcome: Not Progressing Goal: Will remain free from infection Outcome: Not Progressing Goal: Diagnostic test results will improve Outcome: Not Progressing Goal: Respiratory complications will  improve Outcome: Not Progressing Goal: Cardiovascular complication will be avoided Outcome: Not Progressing   Problem: Activity: Goal: Risk for activity intolerance will decrease Outcome: Not Progressing   Problem: Nutrition: Goal: Adequate nutrition will be maintained Outcome: Not Progressing   Problem: Coping: Goal: Level of anxiety will decrease Outcome: Not Progressing   Problem: Elimination: Goal: Will not experience complications related to bowel motility Outcome: Not Progressing Goal: Will not experience complications related to urinary retention Outcome: Not Progressing   Problem: Pain Managment: Goal: General experience of comfort will improve and/or be controlled Outcome: Not Progressing   Problem: Safety: Goal: Ability to remain free from injury will improve Outcome: Not Progressing   Problem: Skin Integrity: Goal: Risk for impaired skin integrity will decrease Outcome: Not Progressing

## 2024-12-29 NOTE — Group Note (Signed)
 Date:  12/29/2024 Time:  10:11 AM  Group Topic/Focus:  Goals Group:   The focus of this group is to help patients establish daily goals to achieve during treatment and discuss how the patient can incorporate goal setting into their daily lives to aide in recovery.    Participation Level:  Active  Participation Quality:  Appropriate  Affect:  Appropriate  Cognitive:  Appropriate  Insight: Appropriate  Engagement in Group:  Engaged  Modes of Intervention:  Activity  Additional Comments:    Camellia HERO Abdo Denault 12/29/2024, 10:11 AM

## 2024-12-29 NOTE — Group Note (Unsigned)
 Date:  12/29/2024 Time:  10:51 AM  Group Topic/Focus:  Self Care:   The focus of this group is to help patients understand the importance of self-care in order to improve or restore emotional, physical, spiritual, interpersonal, and financial health.     Participation Level:  {BHH PARTICIPATION OZCZO:77735}  Participation Quality:  {BHH PARTICIPATION QUALITY:22265}  Affect:  {BHH AFFECT:22266}  Cognitive:  {BHH COGNITIVE:22267}  Insight: {BHH Insight2:20797}  Engagement in Group:  {BHH ENGAGEMENT IN HMNLE:77731}  Modes of Intervention:  {BHH MODES OF INTERVENTION:22269}  Additional Comments:  ***  Laura-Lee Villegas 12/29/2024, 10:51 AM

## 2024-12-29 NOTE — Progress Notes (Signed)
 Patient requested this writer to administer medication prior to leaving. Patient is not comfortable with night nurse, or anyone other than African American giving her medication. Scheduled 2200 Seroquel  was given at 2020.
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
## 2024-12-29 NOTE — Progress Notes (Signed)
 1:1 Patient Hourly Rounding (while awake)  0800: Patient is in the hallway, agitated about going up to Labor & Delivery, with her assigned safety sitter present.  0900: Patient is in the dayroom, with staff and other patients.  1000: Patient remains in the dayroom with her assigned safety sitter present.  1100: Patient is in the hallway, pacing the halls, with her assigned safety sitter present.  1200: Patient is in her room, being assessed by the L&D nurses and CNM, along with this clinical research associate as her assigned recruitment consultant.  1300: Patient is in bed, awake, with her assigned safety sitter present at bedside.  1600: Patient is in her room, in the bathroom, with her assigned safety sitter present.  1700: Patient is in the dayroom, eating dinner, with her assigned safety sitter present.  1800: Patient is on the phone, with her assigned safety sitter present.  1900: Patient is in the dayroom, watching TV with the other patients on the unit, and her assigned safety sitter present.

## 2024-12-29 NOTE — Group Note (Signed)
 Recreation Therapy Group Note   Group Topic:Coping Skills  Group Date: 12/29/2024 Start Time: 1530 End Time: 1605 Facilitators: Celestia Jeoffrey BRAVO, LRT, CTRS Location: Courtyard  Group Description: [employer]. LRT and patients played games of basketball, drew with chalk, and played corn hole while outside in the courtyard while getting fresh air and sunlight. Music was being played in the background. LRT and peers conversed about different games they have played before, what they do in their free time and anything else that is on their minds. LRT encouraged pts to drink water after being outside, sweating and getting their heart rate up.  Goal Area(s) Addressed: Patient will build on frustration tolerance skills. Patients will partake in a competitive play game with peers. Patients will gain knowledge of new leisure interest/hobby   Affect/Mood: N/A   Participation Level: Did not attend    Clinical Observations/Individualized Feedback: Patient did not attend.  Plan: Continue to engage patient in RT group sessions 2-3x/week.   Jeoffrey BRAVO Celestia, LRT, CTRS 12/29/2024 5:17 PM

## 2024-12-29 NOTE — Progress Notes (Signed)
 MFM Plan of Care Patient is currently admitted for acute psychosis in the setting of schizophrenia with significant agitation, limiting meaningful provider interaction. She is admitted to the ER psychiatry area and is being treated with antipsychotic medications, with some early improvement noted. At this time, she does not have decision-making capacity.  I discussed the case with the OB provider, including concern for possible gestational hypertension versus preeclampsia. Under usual circumstances, delivery would be recommended at 37 weeks. However, given the patients current psychiatric condition, lack of capacity, and the need for stabilization, guardianship, or potential recovery of capacity, it would be beneficial to allow additional time for mental health improvement if safely possible (38-39 weeks)  In the absence of preeclampsia with severe features, placental abruption, or other emergent maternal or fetal indications, it is reasonable to continue the pregnancy beyond 37 weeks to allow time for psychiatric stabilization and clarification of decision-making authority.  Ideally, fetal assessment would include ultrasound evaluation for growth and amniotic fluid; however, the patient is consistently refusing ultrasound examinations when offered. Fetal surveillance should include daily nonstress testing if she will allow. If NSTs are not feasible due to refusal or agitation, then fetal heart tones should be obtained three times daily.  This plan should be reassessed frequently, with a low threshold for delivery should maternal or fetal status worsen.  Recommendation - Ok to prolong pregnancy give her mental status and potential for improvement - Continue serial BP assessment (q4h) as long as no severe range BP noted - Daily NST if possible. If not possible then q shift heart tones - Growth US  if patient allows - Daily CMP/CBC  - Delivery can be delayed until guardianship is obtained or  significant improvement of mental status or if there are signs of preeclampsia with severe features or other indications arise.

## 2024-12-29 NOTE — Group Note (Signed)
 Date:  12/29/2024 Time:  10:03 AM  Group Topic/Focus:  Activity Group: The focus of the group is to encourage the patients to go outside to the courtyard and get some fresh air and some exercise.    Participation Level:  Did Not Attend   Camellia HERO Nadiya Pieratt 12/29/2024, 10:03 AM

## 2024-12-29 NOTE — Group Note (Signed)
"                                                 [facility] LCSW Group Therapy Note    Group Date: 12/29/2024 Start Time: 1300 End Time: 1350  Type of Therapy and Topic:  Group Therapy:  Overcoming Obstacles  Participation Level:  [facility] PARTICIPATION LEVEL: Did Not Attend  Description of Group:   In this group patients will be encouraged to explore what they see as obstacles to their own wellness and recovery. They will be guided to discuss their thoughts, feelings, and behaviors related to these obstacles. The group will process together ways to cope with barriers, with attention given to specific choices patients can make. Each patient will be challenged to identify changes they are motivated to make in order to overcome their obstacles. This group will be process-oriented, with patients participating in exploration of their own experiences as well as giving and receiving support and challenge from other group members.  Therapeutic Goals: 1. Patient will identify personal and current obstacles as they relate to admission. 2. Patient will identify barriers that currently interfere with their wellness or overcoming obstacles.  3. Patient will identify feelings, thought process and behaviors related to these barriers. 4. Patient will identify two changes they are willing to make to overcome these obstacles:    Summary of Patient Progress Patient did not attend group.   Therapeutic Modalities:   Cognitive Behavioral Therapy Solution Focused Therapy Motivational Interviewing Relapse Prevention Therapy   Nadara JONELLE Fam, LCSW "

## 2024-12-30 ENCOUNTER — Inpatient Hospital Stay: Payer: MEDICAID

## 2024-12-30 LAB — CBC
HCT: 35.2 % — ABNORMAL LOW (ref 36.0–46.0)
Hemoglobin: 11.9 g/dL — ABNORMAL LOW (ref 12.0–15.0)
MCH: 31.1 pg (ref 26.0–34.0)
MCHC: 33.8 g/dL (ref 30.0–36.0)
MCV: 91.9 fL (ref 80.0–100.0)
Platelets: 209 K/uL (ref 150–400)
RBC: 3.83 MIL/uL — ABNORMAL LOW (ref 3.87–5.11)
RDW: 13.2 % (ref 11.5–15.5)
WBC: 13.6 K/uL — ABNORMAL HIGH (ref 4.0–10.5)
nRBC: 0 % (ref 0.0–0.2)

## 2024-12-30 LAB — COMPREHENSIVE METABOLIC PANEL WITH GFR
ALT: 19 U/L (ref 0–44)
AST: 19 U/L (ref 15–41)
Albumin: 3.4 g/dL — ABNORMAL LOW (ref 3.5–5.0)
Alkaline Phosphatase: 116 U/L (ref 38–126)
Anion gap: 10 (ref 5–15)
BUN: 11 mg/dL (ref 6–20)
CO2: 21 mmol/L — ABNORMAL LOW (ref 22–32)
Calcium: 8.9 mg/dL (ref 8.9–10.3)
Chloride: 103 mmol/L (ref 98–111)
Creatinine, Ser: 0.68 mg/dL (ref 0.44–1.00)
GFR, Estimated: >60 mL/min
Glucose, Bld: 97 mg/dL (ref 70–99)
Potassium: 4.1 mmol/L (ref 3.5–5.1)
Sodium: 135 mmol/L (ref 135–145)
Total Bilirubin: <0.2 mg/dL (ref 0.0–1.2)
Total Protein: 6.9 g/dL (ref 6.5–8.1)

## 2024-12-30 LAB — HEPATITIS B SURFACE ANTIGEN: Hepatitis B Surface Ag: NONREACTIVE

## 2024-12-30 LAB — HEPATITIS C ANTIBODY: HCV Ab: NONREACTIVE

## 2024-12-30 NOTE — Plan of Care (Signed)
  Problem: Education: Goal: Knowledge of [facility] Education information/materials will improve Outcome: Not Progressing Goal: Emotional status will improve Outcome: Not Progressing Goal: Mental status will improve Outcome: Not Progressing Goal: Verbalization of understanding the information provided will improve Outcome: Not Progressing   Problem: Activity: Goal: Interest or engagement in activities will improve Outcome: Not Progressing Goal: Sleeping patterns will improve Outcome: Not Progressing   Problem: Coping: Goal: Ability to verbalize frustrations and anger appropriately will improve Outcome: Not Progressing Goal: Ability to demonstrate self-control will improve Outcome: Not Progressing   Problem: Health Behavior/Discharge Planning: Goal: Identification of resources available to assist in meeting health care needs will improve Outcome: Not Progressing Goal: Compliance with treatment plan for underlying cause of condition will improve Outcome: Not Progressing   Problem: Physical Regulation: Goal: Ability to maintain clinical measurements within normal limits will improve Outcome: Not Progressing   Problem: Safety: Goal: Periods of time without injury will increase Outcome: Not Progressing   Problem: Education: Goal: Knowledge of General Education information will improve Description: Including pain rating scale, medication(s)/side effects and non-pharmacologic comfort measures Outcome: Not Progressing   Problem: Health Behavior/Discharge Planning: Goal: Ability to manage health-related needs will improve Outcome: Not Progressing   Problem: Clinical Measurements: Goal: Ability to maintain clinical measurements within normal limits will improve Outcome: Not Progressing Goal: Will remain free from infection Outcome: Not Progressing Goal: Diagnostic test results will improve Outcome: Not Progressing Goal: Respiratory complications will  improve Outcome: Not Progressing Goal: Cardiovascular complication will be avoided Outcome: Not Progressing   Problem: Activity: Goal: Risk for activity intolerance will decrease Outcome: Not Progressing   Problem: Nutrition: Goal: Adequate nutrition will be maintained Outcome: Not Progressing   Problem: Coping: Goal: Level of anxiety will decrease Outcome: Not Progressing   Problem: Elimination: Goal: Will not experience complications related to bowel motility Outcome: Not Progressing Goal: Will not experience complications related to urinary retention Outcome: Not Progressing   Problem: Pain Managment: Goal: General experience of comfort will improve and/or be controlled Outcome: Not Progressing   Problem: Safety: Goal: Ability to remain free from injury will improve Outcome: Not Progressing   Problem: Skin Integrity: Goal: Risk for impaired skin integrity will decrease Outcome: Not Progressing

## 2024-12-30 NOTE — Group Note (Signed)
 Recreation Therapy Group Note   Group Topic:Relaxation  Group Date: 12/30/2024 Start Time: 1525 End Time: 1605 Facilitators: Celestia Jeoffrey BRAVO, LRT, CTRS Location: Courtyard  Group Description: Meditation. LRT and patients discussed what they know about meditation and mindfulness. LRT played a Deep Breathing Meditation exercise script for patients to follow along to. LRT and patients discussed how meditation and deep breathing can be used as a coping skill post--discharge to help manage symptoms of stress.   Goal Area(s) Addressed: Patient will practice using relaxation technique. Patient will identify a new coping skill.  Patient will follow multistep directions to reduce anxiety and stress.   Affect/Mood: Flat   Participation Level: Minimal    Clinical Observations/Individualized Feedback: Eileen Moody was in and out of group. Pt was noted to be talking to herself.   Plan: Continue to engage patient in RT group sessions 2-3x/week.   Jeoffrey BRAVO Celestia, LRT, CTRS 12/30/2024 5:10 PM

## 2024-12-30 NOTE — Group Note (Signed)
 Date:  12/30/2024 Time:  3:59 PM  Group Topic/Focus:  Goals Group:   The focus of this group is to help patients establish daily goals to achieve during treatment and discuss how the patient can incorporate goal setting into their daily lives to aide in recovery.    Participation Level:  Active  Participation Quality:  Appropriate  Affect:  Appropriate  Cognitive:  Alert  Insight: Appropriate  Engagement in Group:  Engaged  Modes of Intervention:  Activity, Discussion, and Education  Additional Comments:    Skippy LITTIE Bennett 12/30/2024, 3:59 PM

## 2024-12-30 NOTE — Plan of Care (Signed)
" °  Problem: Activity: Goal: Interest or engagement in activities will improve Outcome: Progressing Goal: Sleeping patterns will improve Outcome: Progressing   Problem: Activity: Goal: Risk for activity intolerance will decrease Outcome: Progressing   Problem: Nutrition: Goal: Adequate nutrition will be maintained Outcome: Progressing   Problem: Coping: Goal: Level of anxiety will decrease Outcome: Progressing   "

## 2024-12-30 NOTE — B[facility] Counselor (Signed)
 CSW was present as the patient met with Nia at [facility] DSS.  Patient was pleasant and cooperative during the interaction.  CSW notes that patient was oriented to self, date, and year, however was disorganized in thought.   Nia reports that she will continue to work with the patient and see her weekly.  Nia was curious of disposition plans for the patient and CSW shared that no recommendations have been made at this time.   Nia expressed that DSS would be concerned ethically if the patient was to be discharged while still in a confused state similar to the one she presented to the hospital in.  CSW expressed that she will discuss with the treatment team.  Sherryle Margo, MSW, LCSW 12/30/2024 9:34 AM

## 2024-12-30 NOTE — B[facility] Counselor (Signed)
 CSW was provided the following as contact information for [facility] DSS leadership for [employer].  [email or web address] [phone] [employer] Exhibitions Officer) [email or web address] [phone] (Director)  Sherryle Margo, MSW, LCSW 12/30/2024 3:33 PM

## 2024-12-30 NOTE — Group Note (Signed)
 Recreation Therapy Group Note   Group Topic:Coping Skills  Group Date: 12/30/2024 Start Time: 1010 End Time: 1050 Facilitators: Celestia Jeoffrey BRAVO, LRT, CTRS Location: Craft Room  Group Description: Mind Map.  Patient was provided a blank template of a diagram with 32 blank boxes in a tiered system, branching from the center (similar to a bubble chart). LRT directed patients to label the middle of the diagram Coping Skills. LRT and patients then came up with 8 different coping skills as examples. Pt were directed to record their coping skills in the 2nd tier boxes closest to the center.  Patients would then share their coping skills with the group as LRT wrote them out. LRT gave a handout of 99 different coping skills at the end of group.   Goal Area(s) Addressed: Patients will be able to define coping skills. Patient will identify new coping skills.  Patient will increase communication.   Affect/Mood: Blunted   Participation Level: Moderate   Participation Quality: Independent   Behavior: Cooperative and Hallucinating   Speech/Thought Process: Loose association   Insight: Fair   Judgement: Fair    Modes of Intervention: Clarification, Education, Worksheet, and Writing   Patient Response to Interventions:  Receptive   Education Outcome:  In group clarification offered    Clinical Observations/Individualized Feedback: Eileen Moody was somewhat active in their participation of session activities and group discussion. Pt identified swimming, dancing and track as coping skills.    Plan: Continue to engage patient in RT group sessions 2-3x/week.   Jeoffrey BRAVO Celestia, LRT, CTRS 12/30/2024 11:12 AM

## 2024-12-30 NOTE — Progress Notes (Signed)
 Subjective: Patient reports feeling fetal movement , states she has been feeling contractions and is losing her mucus plug. Requested cervical exam   Objective: BP 104/61 (BP Location: Left Arm)   Pulse 93   Temp 98.4 F (36.9 C) (Oral)   Resp 16   Ht 5' 5 (1.651 m)   Wt 74.8 kg   LMP 05/01/2024 (Approximate)   SpO2 98%   BMI 27.46 kg/m   I have reviewed patient's vital signs.  General: alert and cooperative Resp: breathing without difficulty   Abdomen: gravid non tender SVE 2-3/50/-1  NST Baseline 150, min to moderate variability accel 15x15 x1 no decel  TOCO: rare contractions   Assessment/Plan: [id number] at 79w5days admitted to Behavioral Health   -Gestational Hypertension: daily labs, not collected 1/13 -NRNST: BPP ordered, will repeat NST later today  -Growth US  ordered  -See Dr Marchia note from 1/9 if pt needs to be transferred to L and D     LOS: 10 days    JINNIE HERO [facility], CNM 12/30/2024, 12:23 PM

## 2024-12-30 NOTE — Progress Notes (Signed)
 Subjective: Patient reports feeling contractions, leaking fluid and had some blood when wiping. Reports her pain is getting worse, desires cervical exam and to be induced.   Objective: BP 137/89 (BP Location: Left Arm)   Pulse (!) 114   Temp 98.4 F (36.9 C) (Oral)   Resp 16   Ht 5' 5 (1.651 m)   Wt 74.8 kg   LMP 05/01/2024 (Approximate)   SpO2 100%   BMI 27.46 kg/m   I have reviewed patient's vital signs and labs.  General: alert and cooperative Resp: breathing without difficulty SVE 3/50/-1  EFM baseline 140, moderate variability, pos accel, neg decel TOCO: occasional contraction   Assessment/Plan: [id number] at 49w5days admitted to Behavioral Health    -Gestational Hypertension: daily labs, collected today WNL  -Limited/No prenatal care: Hep B, Hep C, RPR, Rubella, Varicella collected  -Fetal wellbeing: RNST, BPP 8/8 -Growth US  ordered  report shows [facility] at [redacted]w[redacted]d -See Dr Marchia note from 1/9 if pt needs to be transferred to L and D   -Eileen Moody evaluated with RN T, pt's brother also present for NST, pt sat calmly for NST, verbalized feeling contractions but able to sit still, no obvious signs of distress, once back in pt's room, peri pad shown to this CNM, pad damp no bleeding noted, perineum dry. Reassured pt that she is safe to continue pregnancy, pt asked to be induced, discussed at this time we do not need to induce, but we may need to in the future, pt expressed concern about being pregnant until January 27 as her due date was January 9, again reassured pt is safe to continue her pregnancy, she most likely will have the baby by January 27.      LOS: 10 days    Eileen Moody [facility], CNM 12/30/2024, 8:32 PM
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
## 2024-12-30 NOTE — Progress Notes (Signed)
" °   12/29/24 2000  Psych Admission Type (Psych Patients Only)  Admission Status Involuntary  Psychosocial Assessment  Patient Complaints None  Eye Contact Fair;Watchful  Facial Expression Flat  Affect Flat  Speech Soft  Interaction No initiation  Motor Activity Slow  Appearance/Hygiene Improved  Behavior Characteristics Cooperative  Mood Pleasant  Thought Process  Coherency Unable to assess  Content UTA  Delusions UTA  Perception UTA  Hallucination UTA  Judgment Poor  Confusion Moderate  Danger to Self  Current suicidal ideation? Denies  Agreement Not to Harm Self Yes  Description of Agreement verbal  Danger to Others  Danger to Others None reported or observed  Danger to Others Abnormal  Destructive Behavior No threats or harm toward property    "

## 2024-12-30 NOTE — BHH Counselor (Signed)
 CSW followed up with the patient's [facility] SW, Nia, on the outcome of earlier reports that pt's mother was seeking guardianship.    CSW was informed that Nia assisted the mother in completing the paperwork, however, is unsure of any outcomes after the mother turned in the paperwork to the courts.  She reports that she will follow up with this CSW.  Sherryle Margo, MSW, LCSW 12/30/2024 3:57 PM
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
## 2024-12-30 NOTE — Group Note (Signed)
 Date:  12/30/2024 Time:  8:37 PM    Additional Comments:  Did not attend group.  Eileen Moody 12/30/2024, 8:37 PM

## 2024-12-30 NOTE — Group Note (Signed)
 LCSW Group Therapy Note  Group Date: 12/30/2024 Start Time: 1245 End Time: 1340   Type of Therapy and Topic:  Group Therapy: Positive Affirmations  Participation Level:  Active   Description of Group:   This group addressed positive affirmation towards self and others.  Patients went around the room and identified two positive things about themselves and two positive things about a peer in the room.  Patients reflected on how it felt to share something positive with others, to identify positive things about themselves, and to hear positive things from others/ Patients were encouraged to have a daily reflection of positive characteristics or circumstances.   Therapeutic Goals: Patients will verbalize two of their positive qualities Patients will demonstrate empathy for others by stating two positive qualities about a peer in the group Patients will verbalize their feelings when voicing positive self affirmations and when voicing positive affirmations of others Patients will discuss the potential positive impact on their wellness/recovery of focusing on positive traits of self and others.  Summary of Patient Progress:  Patient minimally engaged in the discussion and was able to identify positive affirmations about themselves as well as other group members. Patient demonstrated proficient insight into the subject matter, was respectful of peers, participated throughout the entire session.     Therapeutic Modalities:   Cognitive Behavioral Therapy Motivational Interviewing    Eileen Moody, [place] 12/30/2024  1:42 PM

## 2024-12-30 NOTE — Plan of Care (Signed)
 " Problem: Education: Goal: Knowledge of [facility] Education information/materials will improve 12/30/2024 1636 by Shirley Jon FALCON, RN Outcome: Not Progressing 12/30/2024 1634 by Shirley Jon FALCON, RN Outcome: Not Progressing Goal: Emotional status will improve 12/30/2024 1636 by Shirley Jon FALCON, RN Outcome: Not Progressing 12/30/2024 1634 by Shirley Jon FALCON, RN Outcome: Not Progressing Goal: Mental status will improve 12/30/2024 1636 by Shirley Jon FALCON, RN Outcome: Not Progressing 12/30/2024 1634 by Shirley Jon FALCON, RN Outcome: Not Progressing Goal: Verbalization of understanding the information provided will improve 12/30/2024 1636 by Shirley Jon FALCON, RN Outcome: Not Progressing 12/30/2024 1634 by Shirley Jon FALCON, RN Outcome: Not Progressing   Problem: Activity: Goal: Interest or engagement in activities will improve 12/30/2024 1636 by Shirley Jon FALCON, RN Outcome: Not Progressing 12/30/2024 1634 by Shirley Jon FALCON, RN Outcome: Not Progressing Goal: Sleeping patterns will improve 12/30/2024 1636 by Shirley Jon FALCON, RN Outcome: Not Progressing 12/30/2024 1634 by Shirley Jon FALCON, RN Outcome: Not Progressing   Problem: Coping: Goal: Ability to verbalize frustrations and anger appropriately will improve 12/30/2024 1636 by Shirley Jon FALCON, RN Outcome: Not Progressing 12/30/2024 1634 by Shirley Jon FALCON, RN Outcome: Not Progressing Goal: Ability to demonstrate self-control will improve 12/30/2024 1636 by Shirley Jon FALCON, RN Outcome: Not Progressing 12/30/2024 1634 by Shirley Jon FALCON, RN Outcome: Not Progressing   Problem: Health Behavior/Discharge Planning: Goal: Identification of resources available to assist in meeting health care needs will improve 12/30/2024 1636 by Shirley Jon FALCON, RN Outcome: Not Progressing 12/30/2024 1634 by Shirley Jon FALCON, RN Outcome: Not Progressing Goal: Compliance with treatment plan for underlying cause of  condition will improve 12/30/2024 1636 by Shirley Jon FALCON, RN Outcome: Not Progressing 12/30/2024 1634 by Shirley Jon FALCON, RN Outcome: Not Progressing   Problem: Physical Regulation: Goal: Ability to maintain clinical measurements within normal limits will improve 12/30/2024 1636 by Shirley Jon FALCON, RN Outcome: Not Progressing 12/30/2024 1634 by Shirley Jon FALCON, RN Outcome: Not Progressing   Problem: Safety: Goal: Periods of time without injury will increase 12/30/2024 1636 by Shirley Jon FALCON, RN Outcome: Not Progressing 12/30/2024 1634 by Shirley Jon FALCON, RN Outcome: Not Progressing   Problem: Education: Goal: Knowledge of General Education information will improve Description: Including pain rating scale, medication(s)/side effects and non-pharmacologic comfort measures 12/30/2024 1636 by Shirley Jon FALCON, RN Outcome: Not Progressing 12/30/2024 1634 by Shirley Jon FALCON, RN Outcome: Not Progressing   Problem: Health Behavior/Discharge Planning: Goal: Ability to manage health-related needs will improve 12/30/2024 1636 by Shirley Jon FALCON, RN Outcome: Not Progressing 12/30/2024 1634 by Shirley Jon FALCON, RN Outcome: Not Progressing   Problem: Clinical Measurements: Goal: Ability to maintain clinical measurements within normal limits will improve 12/30/2024 1636 by Shirley Jon FALCON, RN Outcome: Not Progressing 12/30/2024 1634 by Shirley Jon FALCON, RN Outcome: Not Progressing Goal: Will remain free from infection 12/30/2024 1636 by Shirley Jon FALCON, RN Outcome: Not Progressing 12/30/2024 1634 by Shirley Jon FALCON, RN Outcome: Not Progressing Goal: Diagnostic test results will improve 12/30/2024 1636 by Shirley Jon FALCON, RN Outcome: Not Progressing 12/30/2024 1634 by Shirley Jon FALCON, RN Outcome: Not Progressing Goal: Respiratory complications will improve 12/30/2024 1636 by Shirley Jon FALCON, RN Outcome: Not Progressing 12/30/2024 1634 by Shirley Jon FALCON,  RN Outcome: Not Progressing Goal: Cardiovascular complication will be avoided 12/30/2024 1636 by Shirley Jon FALCON, RN Outcome: Not Progressing 12/30/2024 1634 by Shirley Jon FALCON, RN Outcome: Not Progressing   Problem: Activity: Goal: Risk for activity intolerance will decrease 12/30/2024  1636 by Shirley Jon FALCON, RN Outcome: Not Progressing 12/30/2024 1634 by Shirley Jon FALCON, RN Outcome: Not Progressing   Problem: Nutrition: Goal: Adequate nutrition will be maintained 12/30/2024 1636 by Shirley Jon FALCON, RN Outcome: Not Progressing 12/30/2024 1634 by Shirley Jon FALCON, RN Outcome: Not Progressing   Problem: Coping: Goal: Level of anxiety will decrease 12/30/2024 1636 by Shirley Jon FALCON, RN Outcome: Not Progressing 12/30/2024 1634 by Shirley Jon FALCON, RN Outcome: Not Progressing   Problem: Elimination: Goal: Will not experience complications related to bowel motility 12/30/2024 1636 by Shirley Jon FALCON, RN Outcome: Not Progressing 12/30/2024 1634 by Shirley Jon FALCON, RN Outcome: Not Progressing Goal: Will not experience complications related to urinary retention 12/30/2024 1636 by Shirley Jon FALCON, RN Outcome: Not Progressing 12/30/2024 1634 by Shirley Jon FALCON, RN Outcome: Not Progressing   Problem: Pain Managment: Goal: General experience of comfort will improve and/or be controlled 12/30/2024 1636 by Shirley Jon FALCON, RN Outcome: Not Progressing 12/30/2024 1634 by Shirley Jon FALCON, RN Outcome: Not Progressing   Problem: Safety: Goal: Ability to remain free from injury will improve 12/30/2024 1636 by Shirley Jon FALCON, RN Outcome: Not Progressing 12/30/2024 1634 by Shirley Jon FALCON, RN Outcome: Not Progressing   Problem: Skin Integrity: Goal: Risk for impaired skin integrity will decrease 12/30/2024 1636 by Shirley Jon FALCON, RN Outcome: Not Progressing 12/30/2024 1634 by Shirley Jon FALCON, RN Outcome: Not Progressing   "

## 2024-12-31 LAB — CBC WITH DIFFERENTIAL/PLATELET
Abs Immature Granulocytes: 0.06 K/uL (ref 0.00–0.07)
Basophils Absolute: 0 K/uL (ref 0.0–0.1)
Basophils Relative: 0 %
Eosinophils Absolute: 0.2 K/uL (ref 0.0–0.5)
Eosinophils Relative: 2 %
HCT: 33.3 % — ABNORMAL LOW (ref 36.0–46.0)
Hemoglobin: 11.5 g/dL — ABNORMAL LOW (ref 12.0–15.0)
Immature Granulocytes: 1 %
Lymphocytes Relative: 17 %
Lymphs Abs: 2.1 K/uL (ref 0.7–4.0)
MCH: 31.3 pg (ref 26.0–34.0)
MCHC: 34.5 g/dL (ref 30.0–36.0)
MCV: 90.7 fL (ref 80.0–100.0)
Monocytes Absolute: 1.1 K/uL — ABNORMAL HIGH (ref 0.1–1.0)
Monocytes Relative: 9 %
Neutro Abs: 9.1 K/uL — ABNORMAL HIGH (ref 1.7–7.7)
Neutrophils Relative %: 71 %
Platelets: 209 K/uL (ref 150–400)
RBC: 3.67 MIL/uL — ABNORMAL LOW (ref 3.87–5.11)
RDW: 13.2 % (ref 11.5–15.5)
WBC: 12.6 K/uL — ABNORMAL HIGH (ref 4.0–10.5)
nRBC: 0 % (ref 0.0–0.2)

## 2024-12-31 LAB — PROTEIN / CREATININE RATIO, URINE
Creatinine, Urine: 68 mg/dL
Protein Creatinine Ratio: 0.2 mg/mg — ABNORMAL HIGH
Total Protein, Urine: 11 mg/dL

## 2024-12-31 LAB — COMPREHENSIVE METABOLIC PANEL WITH GFR
ALT: 19 U/L (ref 0–44)
AST: 27 U/L (ref 15–41)
Albumin: 3.2 g/dL — ABNORMAL LOW (ref 3.5–5.0)
Alkaline Phosphatase: 114 U/L (ref 38–126)
Anion gap: 9 (ref 5–15)
BUN: 9 mg/dL (ref 6–20)
CO2: 22 mmol/L (ref 22–32)
Calcium: 9.1 mg/dL (ref 8.9–10.3)
Chloride: 105 mmol/L (ref 98–111)
Creatinine, Ser: 0.79 mg/dL (ref 0.44–1.00)
GFR, Estimated: >60 mL/min
Glucose, Bld: 101 mg/dL — ABNORMAL HIGH (ref 70–99)
Potassium: 4.3 mmol/L (ref 3.5–5.1)
Sodium: 135 mmol/L (ref 135–145)
Total Bilirubin: 0.2 mg/dL (ref 0.0–1.2)
Total Protein: 6.8 g/dL (ref 6.5–8.1)

## 2024-12-31 LAB — VARICELLA ZOSTER ANTIBODY, IGG: Varicella IgG: REACTIVE

## 2024-12-31 LAB — SYPHILIS: RPR W/REFLEX TO RPR TITER AND TREPONEMAL ANTIBODIES, TRADITIONAL SCREENING AND DIAGNOSIS ALGORITHM: RPR Ser Ql: NONREACTIVE

## 2024-12-31 LAB — MEASLES/MUMPS/RUBELLA IMMUNITY
Mumps IgG: 9.1 [AU]/ml — ABNORMAL LOW
Rubella: <0.9 {index} — ABNORMAL LOW
Rubeola IgG: 157 [AU]/ml

## 2024-12-31 MED ORDER — NICOTINE 21 MG/24HR TD PT24
21.0000 mg | MEDICATED_PATCH | Freq: Every day | TRANSDERMAL | Status: DC
  Administered 2024-12-31 – 2025-01-02 (×3): 21 mg via TRANSDERMAL
  Filled 2024-12-31 (×3): qty 1

## 2024-12-31 MED ORDER — QUETIAPINE FUMARATE 100 MG PO TABS
100.0000 mg | ORAL_TABLET | Freq: Every morning | ORAL | Status: DC
  Administered 2025-01-01: 100 mg via ORAL
  Filled 2024-12-31 (×2): qty 1

## 2024-12-31 NOTE — Progress Notes (Signed)
 1:1 Patient Hourly Rounding (while awake)  0800: Patient is in her room, in the bathroom, with her assigned safety sitter present at bedside.  1000: Patient is in her room, awake, with her assigned recruitment consultant.  1100: Patient remains in her room, awake, with her assigned safety sitter present at bedside.  1200: Patient is in the hallway, waiting for her lunch tray, with her assigned safety sitter present.  1300: Patient is in her room, in bed, complaining of pain and discomfort an wanting to be seen by OB. Patient's safety sitter present at bedside.  1400: Patient is in her room, laying awake in bed, with her assigned safety sitter present at bedside.  1500: Patient is in the dayroom, getting her vitals taken, with her assigned safety sitter present.  1600: Patient is in the dayroom ,watching TV waiting for dinner to come, with her assigned safety sitter present.  1700: Patient remains in the dayroom, with other patients and her assigned safety sitter.  1800: Patient is on the phone and her assigned safety sitter is present at her side.  1900: Patient is pacing the hallway, circling the nurses station, with her assigned safety sitter walking beside her.

## 2024-12-31 NOTE — Progress Notes (Signed)
 Patient is complaining of pain and discomfort and does not want to take scheduled Seroquel . MD will be notified.

## 2024-12-31 NOTE — Progress Notes (Addendum)
 Subjective: pt reports to [facility] staff feeling of pelvic pressure and need to push  Objective: pt state that she is ready for delivery. She is standing at the bedside, she does not appear to be writhing in pain, she is not breathing heavy.   Assessment/plan: SVE no cervical change from yesterdays exam 3/60/-2. No blood , or discharge on exam noted. Abdomen soft. Reassurance given to patient and staff that she is not in labor at this time.   Zelda Hummer, CNM

## 2024-12-31 NOTE — BHH Counselor (Signed)
 CSW received call from Eileen Moody at [facility] in regards to the referral that was sent in.  Eileen Moody reports that a Dr. Dorn Moody, has DECLINED the patient, stating that there is not a level of psychosis indicating that there is a benefit from traditional bed, there is no indication that a transfer to [facility] would benefit patient than current placement.  Eileen Moody reports that there is no evidence supporting that [facility] will be able to meet a need that current hospitalization can not address.  Eileen Moody, MSW, LCSW 12/31/2024 3:15 PM

## 2024-12-31 NOTE — Group Note (Signed)
 BHH LCSW Group Therapy Note   Group Date: 12/31/2024 Start Time: 1300 End Time: 1400   Type of Therapy/Topic:  Group Therapy:  Emotion Regulation  Participation Level:  Did Not Attend   Mood:  Description of Group:    The purpose of this group is to assist patients in learning to regulate negative emotions and experience positive emotions. Patients will be guided to discuss ways in which they have been vulnerable to their negative emotions. These vulnerabilities will be juxtaposed with experiences of positive emotions or situations, and patients challenged to use positive emotions to combat negative ones. Special emphasis will be placed on coping with negative emotions in conflict situations, and patients will process healthy conflict resolution skills.  Therapeutic Goals: Patient will identify two positive emotions or experiences to reflect on in order to balance out negative emotions:  Patient will label two or more emotions that they find the most difficult to experience:  Patient will be able to demonstrate positive conflict resolution skills through discussion or role plays:   Summary of Patient Progress:   X    Therapeutic Modalities:   Cognitive Behavioral Therapy Feelings Identification Dialectical Behavioral Therapy   Sherryle JINNY Margo, LCSW

## 2024-12-31 NOTE — Progress Notes (Signed)
 12/31/2024 Letter to the Court  Reg: Eileen Moody DOB: 06/11/1996  Eileen Moody has been hospitalized since 12/20/2024 at [facility]) inpatient psychiatric unit. She is currently being treated for Schizophrenia and is currently [redacted] weeks pregnant.  She remains very delusional and disorganized, aggressive towards staff and other peers.  APS has been involved and her mom applied for legal guardianship.  Patient lacks insight into her mental health problems and has been refusing OB/GYN care and medications.  Patient needs ongoing monitoring of behaviors and delusions and is also very close to her delivery of the child.  As this process is time consuming patient has no safe place to go with out supervision.    We appreciate the court's assistance in stabilizing and currently we request another 30 days of inpatient care to further stabilize.  Sincerely, Eileen Moody.MD [facility]

## 2024-12-31 NOTE — Plan of Care (Signed)
   Problem: Education: Goal: Knowledge of [facility] Education information/materials will improve Outcome: Progressing Goal: Emotional status will improve Outcome: Progressing Goal: Mental status will improve Outcome: Progressing Goal: Verbalization of understanding the information provided will improve Outcome: Progressing   Problem: Activity: Goal: Interest or engagement in activities will improve Outcome: Progressing Goal: Sleeping patterns will improve Outcome: Progressing   Problem: Coping: Goal: Ability to verbalize frustrations and anger appropriately will improve Outcome: Progressing Goal: Ability to demonstrate self-control will improve Outcome: Progressing   Problem: Health Behavior/Discharge Planning: Goal: Identification of resources available to assist in meeting health care needs will improve Outcome: Progressing Goal: Compliance with treatment plan for underlying cause of condition will improve Outcome: Progressing   Problem: Physical Regulation: Goal: Ability to maintain clinical measurements within normal limits will improve Outcome: Progressing   Problem: Safety: Goal: Periods of time without injury will increase Outcome: Progressing   Problem: Education: Goal: Knowledge of General Education information will improve Description: Including pain rating scale, medication(s)/side effects and non-pharmacologic comfort measures Outcome: Progressing   Problem: Health Behavior/Discharge Planning: Goal: Ability to manage health-related needs will improve Outcome: Progressing   Problem: Clinical Measurements: Goal: Ability to maintain clinical measurements within normal limits will improve Outcome: Progressing Goal: Will remain free from infection Outcome: Progressing Goal: Diagnostic test results will improve Outcome: Progressing Goal: Respiratory complications will improve Outcome: Progressing Goal: Cardiovascular complication will be  avoided Outcome: Progressing   Problem: Activity: Goal: Risk for activity intolerance will decrease Outcome: Progressing   Problem: Nutrition: Goal: Adequate nutrition will be maintained Outcome: Progressing   Problem: Coping: Goal: Level of anxiety will decrease Outcome: Progressing   Problem: Elimination: Goal: Will not experience complications related to bowel motility Outcome: Progressing Goal: Will not experience complications related to urinary retention Outcome: Progressing   Problem: Pain Managment: Goal: General experience of comfort will improve and/or be controlled Outcome: Progressing   Problem: Safety: Goal: Ability to remain free from injury will improve Outcome: Progressing   Problem: Skin Integrity: Goal: Risk for impaired skin integrity will decrease Outcome: Progressing

## 2024-12-31 NOTE — Progress Notes (Addendum)
" °  Subjective:    PT reports that there are four babies , that she is feeling fetal movement. Sh has had some contractions and continues to lose her mucus plug. BP elevated yesterday evening x 1 .   Objective:  Today's Vitals   12/30/24 1507 12/30/24 1607 12/30/24 2033 12/31/24 0621  BP: (!) 134/96 137/89  110/78  Pulse: (!) 109 (!) 114  96  Resp:    15  Temp:    98.1 F (36.7 C)  TempSrc:    Oral  SpO2: 100% 100%  99%  Weight:      Height:      PainSc:  0-No pain 0-No pain    Body mass index is 27.46 kg/m.   Reviewed vital signs. PT denies headache, abdominal pain. Visually minimal swelling. She prefers not to be touched by midwife . Unable to assess reflexes at this time.   General: Alert and cooperative with NST Resp: breathing unlabored, regular Abdomen: soft ( per RN who placed monitors), non tender SVE: deferred  NST reactive  Basline 145 Accelerations present Deceleration absent  Ctx: irritability   Assessment/Plan: [id number] at 22w5days admitted to Behavioral Health    -Gestational Hypertension: daily labs, not collected 1/13, orders placed for 1/14.SABRA      Recommend bp check 4 x daily while awake.   -NST daily.  1/14 reactive -BPP 1/13 -8/8 . Growth u/s- Femur length measures 6.7 cm. Estimated gestational age of [redacted] weeks and 3 days. Radiologist notified in request to addend u/s to results with percentile growth. - L&D management plan for labor see MD note 12/26/2024  Zelda Hummer, CNM    "

## 2024-12-31 NOTE — Progress Notes (Signed)
 Patient given a specimen cup for a urine sample. Patient informed to bring up the sample when she has a chance. Patient verbalized understanding.

## 2024-12-31 NOTE — BHH Counselor (Addendum)
 CSW contacted [facility] to check on bed availability and possible need to make additional referral.   CSW was unable to speak with anyone and left HIPAA compliant voicemail.  Sherryle Margo, MSW, LCSW 12/31/2024 9:34 AM   ADDENDUM CSW contacted [facility],  CSW spoke with Chiquita at the [facility]. She report that per her end the last note the patient was to be admitted through psych and then OB would be established once needed.  CSW was informed to send any updated labs, notes and medications since January 8th 2026.  Information is to be faxed to [phone].  Sherryle Margo, MSW, LCSW 12/31/2024 10:19 AM   ADDENDUM CSW has sent referral information with no issues.  CSW received confirmation pages.  Sherryle Margo, MSW, LCSW 12/31/2024 11:26 AM

## 2024-12-31 NOTE — Progress Notes (Signed)
" °   12/31/24 1500  Psych Admission Type (Psych Patients Only)  Admission Status Involuntary  Psychosocial Assessment  Patient Complaints Other (Comment) (pain)  Eye Contact Fair  Facial Expression Pained  Affect Appropriate to circumstance  Speech Logical/coherent  Interaction Assertive  Motor Activity Slow  Appearance/Hygiene In scrubs;Improved  Behavior Characteristics Cooperative;Appropriate to situation  Mood Pleasant  Aggressive Behavior  Effect No apparent injury  Thought Process  Coherency WDL  Content Preoccupation (patient complaining of pain and feelings of pressure)  Delusions None reported or observed  Perception Derealization (people says at times she's having multiple babies)  Hallucination None reported or observed  Judgment Limited  Confusion None  Danger to Self  Current suicidal ideation? Denies  Agreement Not to Harm Self Yes  Description of Agreement Verbal  Danger to Others  Danger to Others None reported or observed  Danger to Others Abnormal  Destructive Behavior No threats or harm toward property    "

## 2024-12-31 NOTE — Plan of Care (Signed)
   Problem: Education: Goal: Knowledge of Eileen Moody General Education information/materials will improve Outcome: Progressing Goal: Emotional status will improve Outcome: Progressing Goal: Mental status will improve Outcome: Progressing Goal: Verbalization of understanding the information provided will improve Outcome: Progressing   Problem: Activity: Goal: Interest or engagement in activities will improve Outcome: Progressing Goal: Sleeping patterns will improve Outcome: Progressing   Problem: Coping: Goal: Ability to verbalize frustrations and anger appropriately will improve Outcome: Progressing Goal: Ability to demonstrate self-control will improve Outcome: Progressing
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
## 2024-12-31 NOTE — Group Note (Signed)
 Date:  12/31/2024 Time:  12:23 PM  Group Topic/Focus:  Building Self Esteem:   The Focus of this group is helping patients become aware of the effects of self-esteem on their lives, the things they and others do that enhance or undermine their self-esteem, seeing the relationship between their level of self-esteem and the choices they make and learning ways to enhance self-esteem.    Participation Level:  Did Not Attend   Clayborne DELENA June 12/31/2024, 12:23 PM

## 2024-12-31 NOTE — Group Note (Signed)
 Date:  12/31/2024 Time:  8:27 PM  Group Topic/Focus:  Rediscovering Joy:   The focus of this group is to explore various ways to relieve stress in a positive manner.    Participation Level:  Did Not Attend  Participation Quality:  none  Affect:  none  Cognitive:  none  Insight: None  Engagement in Group:  none  Modes of Intervention:  none  Additional Comments:  none   Titianna Loomis 12/31/2024, 8:27 PM
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
## 2025-01-01 MED ORDER — QUETIAPINE FUMARATE 200 MG PO TABS: 300.0000 mg | ORAL_TABLET | Freq: Every day | ORAL | Status: DC

## 2025-01-01 MED ORDER — QUETIAPINE FUMARATE 200 MG PO TABS
200.0000 mg | ORAL_TABLET | Freq: Every morning | ORAL | Status: DC
  Administered 2025-01-02: 200 mg via ORAL
  Filled 2025-01-01: qty 1

## 2025-01-01 NOTE — Progress Notes (Signed)
 1:1 Patient Hourly Rounding (while awake)  0900: Patient is in the dayroom, with other patients and her assigned safety sitter present.  1000: Patient is in group, with the other members on the unit and her assigned safety sitter present.  1100: Patient is in the hallway, walking with her assigned safety sitter.  1200: Patient is eating lunch, in the cafeteria/dayroom, with other members and her assigned safety sitter present.  1300: Patient is in the hallway, with her assigned safety sitter present.  1400: Patient is in her bedroom, awake, with her assigned safety sitter present at bedside.  1500: Patient is in group, with other members on the unit, and her assigned recruitment consultant.  1600: Patient is in the dayroom, waiting to get vitals done, with her assigned safety sitter present.  1700: Patient is back in her room, awake and resting, with her assigned safety sitter present at bedside.  1800: Patient remains in her room, awake, with her assigned safety sitter present at bedside.  1900: Patient is in the dayroom, watching TV with the other members on the unit and her assigned safety sitter present.

## 2025-01-01 NOTE — Progress Notes (Signed)

## 2025-01-01 NOTE — Group Note (Signed)
 Recreation Therapy Group Note   Group Topic:Goal Setting  Group Date: 01/01/2025 Start Time: 1000 End Time: 1110 Facilitators: Celestia Jeoffrey BRAVO, LRT, CTRS Location: Craft Room  Group Description: Vision Boards. Patients were given many different magazines, a glue stick, markers, and a piece of cardstock paper. LRT and pts discussed the importance of having goals in life. LRT and pts discussed the difference between short-term and long-term goals, as well as what a SMART goal is. LRT encouraged pts to create a vision board, with images they picked and then cut out with safety scissors from the magazine, for themselves, that capture their short and long-term goals. LRT encouraged pts to show and explain their vision board to the group.   Goal Area(s) Addressed:  Patient will gain knowledge of short vs. long term goals.  Patient will identify goals for themselves. Patient will practice setting SMART goals. Patient will verbalize their goals to LRT and peers.   Affect/Mood: Full range and Flat   Participation Level: Moderate    Clinical Observations/Individualized Feedback: Eileen Moody was present in group. 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 I am pregnant with a prince and princess. The good kind of marijuana is gree leaves, not K2. Pt was noted to be staring at peers and LRT while in group. Pt was also noted to be mumbling to herself.   Plan: Continue to engage patient in RT group sessions 2-3x/week.   Jeoffrey BRAVO Celestia, LRT, CTRS 01/01/2025 11:27 AM

## 2025-01-01 NOTE — Group Note (Signed)
 Recreation Therapy Group Note   Group Topic:Health and Wellness  Group Date: 01/01/2025 Start Time: 1530 End Time: 1610 Facilitators: Celestia Jeoffrey BRAVO, LRT, CTRS Location: Dayroom  Group Description: Seated Exercise. LRT discussed the mental and physical benefits of exercise. LRT and group discussed how physical activity can be used as a coping skill. Pt's and LRT followed along to an exercise video on the TV screen that provided a visual representation and audio description of every exercise performed. Pt's encouraged to listen to their bodies and stop at any time if they experience feelings of discomfort or pain. Pts were encouraged to drink water and stay hydrated.   Goal Area(s) Addressed:  Patient will learn benefits of physical activity. Patient will identify exercise as a coping skill.  Patient will follow multistep directions. Patient will try a new leisure interest.    Affect/Mood: Flat   Participation Level: Non-verbal    Clinical Observations/Individualized Feedback: Philis was present in group. Pt did not complete any of the exercises as prompted.   Plan: Continue to engage patient in RT group sessions 2-3x/week.   Jeoffrey BRAVO Celestia, LRT, CTRS 01/01/2025 5:07 PM

## 2025-01-01 NOTE — Group Note (Signed)
 Date:  01/01/2025 Time:  8:53 PM  Group Topic/Focus:  Emotional Education:   The focus of this group is to discuss what feelings/emotions are, and how they are experienced.    Participation Level:  Active  Participation Quality:  Appropriate  Affect:  Appropriate  Cognitive:  Appropriate  Insight: Appropriate  Engagement in Group:  Engaged  Modes of Intervention:  Discussion and Education  Additional Comments:    Adiya Selmer L 01/01/2025, 8:53 PM

## 2025-01-01 NOTE — Progress Notes (Addendum)
 Labor & Delivery Plan for Care, updated 01/01/2022  Concerns for pt's stagnated psychiatric care brought to my attention today and discussed current state of patient with perinatologist Dr. Fredia Fresh, who recommends due to her continued psychosis and gestational hypertension, that we induce this patient tomorrow at [redacted]w[redacted]d.   Discussed induction plans with Dr. [name], psychiatrist, who gave further medication recommendations for pt's potential agitation (see her note 1/15 and see below). 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 IVC extended to 30 days and remains valid on L&D. Pt's mother still without guardianship, planning for 2-physician attestation on admission.  If granted to the mother while pt is in L&D, team will need to contact her for all medical consents [phone]. She lives from [facility] and has agreed to come in.    L&D admission currently planned for Friday 01/02/25 at 12:00p: Call security for escort/transport 1:1 sitter to come to L&D with patient Dr. Curtiss [facility] physician on-call through 01/04/25) and Dr. Leigh will sign 2-physician attestation for medical decision making authority.  L&D RN or CNM to release patient's admit orders from  'signed & held' Psych consult and PRN meds for agitation are already ordered RN or CNM to call pharmacy and ask for the PRN agitation meds to be sent up to have on standby. Dr. [name], psychiatrist, can be reached on Secure Chat or her cell phone for any questions our team has about her medications.  CNM to notify anesthesia & special care nursery of pt's admission.  Agitation Meds: Continue Seroquel  300mg  at bedtime (ordered) If you anticipate pt to remain on L&D through Sat 1/17 and beyond, clarify with Dr. [name] if pt should continue the new 200mg  AM dose.  -PRN: Start with: Haldol  5mg  PO Q4h prn Mild agitation: confused, irritable,  easily annoyed or angered, intolerant. confused, irritable, easily annoyed or angered, intolerant.  Haldol  5mg  IM Q4h prn Moderate agitation: physical or verbal threats to self, property or others. Threatening or intimidating stance, gestures or language.  Haldol  10mg  IM Q4h prn Severe agitation: attacking objects, kicking, hitting, throwing, damaging objects. Self-injury or assault to others.   Benadryl  can be given in addition to or with the Haldol  Benadryl  50-100mg  Q4h prn any level of agitation Psych advises that given together with Haldol  has worked well for this pt  Zyprexa  10mg  IM Q8h prn Use if you have exhausted the Haldol  and Benadryl  and pt still remains agitated.   If given all these meds and pt still posing a safety risk, psych recommends moving to restraints. Physician will need to order them in Epic under restraints and write a note attesting to medical necessity within 1 hour of the order  Documentation about patient's behaviors requiring these medications should reflect her level of agitation. DO NOT use 'sedation' as a reason. We only use these medications for her agitation.  If actually needing sedation or medications beyond above, will need to involve anesthesia and the ICU, who are both aware of this patient.   Postpartum: -Infant will immediately go into [facility] custody and to the special care nursery -Plan for early patient discharge back to BMU once stable from a hemorrhage standpoint -AOB will continue postpartum rounds on pt in BMU   Estil Leigh, DO [facility]
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
## 2025-01-01 NOTE — Plan of Care (Signed)
   Problem: Education: Goal: Knowledge of [facility] Education information/materials will improve Outcome: Progressing Goal: Emotional status will improve Outcome: Progressing Goal: Mental status will improve Outcome: Progressing Goal: Verbalization of understanding the information provided will improve Outcome: Progressing   Problem: Activity: Goal: Interest or engagement in activities will improve Outcome: Progressing Goal: Sleeping patterns will improve Outcome: Progressing   Problem: Coping: Goal: Ability to verbalize frustrations and anger appropriately will improve Outcome: Progressing Goal: Ability to demonstrate self-control will improve Outcome: Progressing   Problem: Health Behavior/Discharge Planning: Goal: Identification of resources available to assist in meeting health care needs will improve Outcome: Progressing Goal: Compliance with treatment plan for underlying cause of condition will improve Outcome: Progressing   Problem: Physical Regulation: Goal: Ability to maintain clinical measurements within normal limits will improve Outcome: Progressing   Problem: Safety: Goal: Periods of time without injury will increase Outcome: Progressing   Problem: Education: Goal: Knowledge of General Education information will improve Description: Including pain rating scale, medication(s)/side effects and non-pharmacologic comfort measures Outcome: Progressing   Problem: Health Behavior/Discharge Planning: Goal: Ability to manage health-related needs will improve Outcome: Progressing   Problem: Clinical Measurements: Goal: Ability to maintain clinical measurements within normal limits will improve Outcome: Progressing Goal: Will remain free from infection Outcome: Progressing Goal: Diagnostic test results will improve Outcome: Progressing Goal: Respiratory complications will improve Outcome: Progressing Goal: Cardiovascular complication will be  avoided Outcome: Progressing   Problem: Activity: Goal: Risk for activity intolerance will decrease Outcome: Progressing   Problem: Nutrition: Goal: Adequate nutrition will be maintained Outcome: Progressing   Problem: Coping: Goal: Level of anxiety will decrease Outcome: Progressing   Problem: Elimination: Goal: Will not experience complications related to bowel motility Outcome: Progressing Goal: Will not experience complications related to urinary retention Outcome: Progressing   Problem: Pain Managment: Goal: General experience of comfort will improve and/or be controlled Outcome: Progressing   Problem: Safety: Goal: Ability to remain free from injury will improve Outcome: Progressing   Problem: Skin Integrity: Goal: Risk for impaired skin integrity will decrease Outcome: Progressing
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
## 2025-01-01 NOTE — Plan of Care (Signed)
   Problem: Education: Goal: Emotional status will improve Outcome: Progressing Goal: Mental status will improve Outcome: Progressing

## 2025-01-01 NOTE — Group Note (Signed)
 Date:  01/01/2025 Time:  10:11 AM  Group Topic/Focus:  Building Self Esteem:   The Focus of this group is helping patients become aware of the effects of self-esteem on their lives, the things they and others do that enhance or undermine their self-esteem, seeing the relationship between their level of self-esteem and the choices they make and learning ways to enhance self-esteem.    Participation Level:  Active  Participation Quality:  Appropriate  Affect:  Appropriate  Cognitive:  Appropriate  Insight: Appropriate  Engagement in Group:  Engaged  Modes of Intervention:  Activity  Additional Comments:    Camellia HERO Kennisha Qin 01/01/2025, 10:11 AM

## 2025-01-01 NOTE — Progress Notes (Signed)
 Subjective: Eileen Moody states overall she is feeling good, no OB complaints. Endorses good FM.  Objective: BP (!) 139/92 (BP Location: Left Arm)   Pulse (!) 103   Temp 98.3 F (36.8 C) (Oral)   Resp 18   Ht 5' 5 (1.651 m)   Wt 74.8 kg   LMP 05/01/2024 (Approximate)   SpO2 100%   BMI 27.46 kg/m   Abd: soft, gravid SVE: deferred  FHT: 150, mod variability, positive accels, no decels Ucs: one contraction within  Assessment: 29y/o G5P3 at 38wk0d RNST Not in labor  GHTN- mild range pressures  Plan: Repeat NST tomorrow [facility] labs were done yesterday, do not need to repeat today   01/01/25 2:43 PM Lolita Loots CNM, FNP

## 2025-01-01 NOTE — Group Note (Signed)
 [facility] LCSW Group Therapy Note   Group Date: 01/01/2025 Start Time: 1300 End Time: 1400   Type of Therapy/Topic:  Group Therapy:  Balance in Life  Participation Level:  None   Description of Group:    This group will address the concept of balance and how it feels and looks when one is unbalanced. Patients will be encouraged to process areas in their lives that are out of balance, and identify reasons for remaining unbalanced. Facilitators will guide patients utilizing problem- solving interventions to address and correct the stressor making their life unbalanced. Understanding and applying boundaries will be explored and addressed for obtaining  and maintaining a balanced life. Patients will be encouraged to explore ways to assertively make their unbalanced needs known to significant others in their lives, using other group members and facilitator for support and feedback.  Therapeutic Goals: Patient will identify two or more emotions or situations they have that consume much of in their lives. Patient will identify signs/triggers that life has become out of balance:  Patient will identify two ways to set boundaries in order to achieve balance in their lives:  Patient will demonstrate ability to communicate their needs through discussion and/or role plays  Summary of Patient Progress: Patient was present for the beginning of the group. She participated in the icebreaker but not further in the larger group discussion.   Therapeutic Modalities:   Cognitive Behavioral Therapy Solution-Focused Therapy Assertiveness Training   Nadara JONELLE Fam, LCSW

## 2025-01-01 NOTE — Progress Notes (Signed)
" °   01/01/25 1800  Psych Admission Type (Psych Patients Only)  Admission Status Involuntary  Psychosocial Assessment  Patient Complaints None  Eye Contact Fair  Facial Expression Other (Comment) (appropriate)  Affect Appropriate to circumstance  Speech Logical/coherent  Interaction Assertive  Motor Activity Slow  Appearance/Hygiene Improved;Layered clothes;Unremarkable  Behavior Characteristics Cooperative;Appropriate to situation  Mood Pleasant  Aggressive Behavior  Effect No apparent injury  Thought Process  Coherency WDL  Content Preoccupation (patient has been fixated on discharge today.)  Delusions None reported or observed  Perception Derealization (patient says at times she's having multiple babies)  Hallucination None reported or observed  Judgment Limited  Confusion None  Danger to Self  Current suicidal ideation? Denies  Agreement Not to Harm Self Yes  Description of Agreement Verbal  Danger to Others  Danger to Others None reported or observed  Danger to Others Abnormal  Destructive Behavior No threats or harm toward property   Patient's goal for today, per her self-inventory is yourself, in which she will have self-respect in order to achieve her goal. "

## 2025-01-01 NOTE — Progress Notes (Signed)
" °   01/01/25 2100  Psych Admission Type (Psych Patients Only)  Admission Status Involuntary  Psychosocial Assessment  Patient Complaints None  Eye Contact Fair  Facial Expression Anxious  Affect Appropriate to circumstance  Speech Logical/coherent  Interaction Assertive  Motor Activity Slow  Appearance/Hygiene In scrubs  Behavior Characteristics Cooperative;Appropriate to situation  Mood Pleasant  Thought Process  Coherency WDL  Content Preoccupation  Delusions None reported or observed  Perception Derealization  Hallucination None reported or observed  Judgment Limited  Confusion None  Danger to Self  Current suicidal ideation? Denies  Agreement Not to Harm Self Yes  Description of Agreement verbal  Danger to Others  Danger to Others None reported or observed  Danger to Others Abnormal  Destructive Behavior No threats or harm toward property    "

## 2025-01-02 ENCOUNTER — Inpatient Hospital Stay: Payer: MEDICAID | Admitting: Anesthesiology

## 2025-01-02 ENCOUNTER — Encounter: Payer: Self-pay | Admitting: Obstetrics

## 2025-01-02 ENCOUNTER — Other Ambulatory Visit: Payer: Self-pay

## 2025-01-02 ENCOUNTER — Inpatient Hospital Stay
Admission: RE | Admit: 2025-01-02 | Discharge: 2025-01-03 | DRG: 806 | Payer: MEDICAID | Attending: Certified Nurse Midwife | Admitting: Certified Nurse Midwife

## 2025-01-02 DIAGNOSIS — O99334 Smoking (tobacco) complicating childbirth: Secondary | ICD-10-CM | POA: Diagnosis present

## 2025-01-02 DIAGNOSIS — O134 Gestational [pregnancy-induced] hypertension without significant proteinuria, complicating childbirth: Principal | ICD-10-CM

## 2025-01-02 DIAGNOSIS — Z3A38 38 weeks gestation of pregnancy: Secondary | ICD-10-CM

## 2025-01-02 DIAGNOSIS — O99344 Other mental disorders complicating childbirth: Secondary | ICD-10-CM

## 2025-01-02 DIAGNOSIS — F17210 Nicotine dependence, cigarettes, uncomplicated: Secondary | ICD-10-CM | POA: Diagnosis present

## 2025-01-02 DIAGNOSIS — O9902 Anemia complicating childbirth: Secondary | ICD-10-CM | POA: Diagnosis present

## 2025-01-02 DIAGNOSIS — O0933 Supervision of pregnancy with insufficient antenatal care, third trimester: Secondary | ICD-10-CM

## 2025-01-02 DIAGNOSIS — F200 Paranoid schizophrenia: Secondary | ICD-10-CM | POA: Diagnosis present

## 2025-01-02 DIAGNOSIS — O139 Gestational [pregnancy-induced] hypertension without significant proteinuria, unspecified trimester: Secondary | ICD-10-CM | POA: Diagnosis present

## 2025-01-02 DIAGNOSIS — F259 Schizoaffective disorder, unspecified: Secondary | ICD-10-CM

## 2025-01-02 LAB — CBC WITH DIFFERENTIAL/PLATELET
Abs Immature Granulocytes: 0.1 K/uL — ABNORMAL HIGH (ref 0.00–0.07)
Basophils Absolute: 0 K/uL (ref 0.0–0.1)
Basophils Relative: 0 %
Eosinophils Absolute: 0.2 K/uL (ref 0.0–0.5)
Eosinophils Relative: 2 %
HCT: 33.9 % — ABNORMAL LOW (ref 36.0–46.0)
Hemoglobin: 11.6 g/dL — ABNORMAL LOW (ref 12.0–15.0)
Immature Granulocytes: 1 %
Lymphocytes Relative: 15 %
Lymphs Abs: 1.9 K/uL (ref 0.7–4.0)
MCH: 31.3 pg (ref 26.0–34.0)
MCHC: 34.2 g/dL (ref 30.0–36.0)
MCV: 91.4 fL (ref 80.0–100.0)
Monocytes Absolute: 0.8 K/uL (ref 0.1–1.0)
Monocytes Relative: 6 %
Neutro Abs: 9.3 K/uL — ABNORMAL HIGH (ref 1.7–7.7)
Neutrophils Relative %: 76 %
Platelets: 212 K/uL (ref 150–400)
RBC: 3.71 MIL/uL — ABNORMAL LOW (ref 3.87–5.11)
RDW: 13.4 % (ref 11.5–15.5)
WBC: 12.3 K/uL — ABNORMAL HIGH (ref 4.0–10.5)
nRBC: 0 % (ref 0.0–0.2)

## 2025-01-02 LAB — COMPREHENSIVE METABOLIC PANEL WITH GFR
ALT: 20 U/L (ref 0–44)
AST: 23 U/L (ref 15–41)
Albumin: 3.3 g/dL — ABNORMAL LOW (ref 3.5–5.0)
Alkaline Phosphatase: 111 U/L (ref 38–126)
Anion gap: 12 (ref 5–15)
BUN: 9 mg/dL (ref 6–20)
CO2: 19 mmol/L — ABNORMAL LOW (ref 22–32)
Calcium: 9.1 mg/dL (ref 8.9–10.3)
Chloride: 107 mmol/L (ref 98–111)
Creatinine, Ser: 0.66 mg/dL (ref 0.44–1.00)
GFR, Estimated: >60 mL/min
Glucose, Bld: 160 mg/dL — ABNORMAL HIGH (ref 70–99)
Potassium: 4 mmol/L (ref 3.5–5.1)
Sodium: 138 mmol/L (ref 135–145)
Total Bilirubin: 0.2 mg/dL (ref 0.0–1.2)
Total Protein: 6.8 g/dL (ref 6.5–8.1)

## 2025-01-02 LAB — CBC
HCT: 33.5 % — ABNORMAL LOW (ref 36.0–46.0)
Hemoglobin: 11.5 g/dL — ABNORMAL LOW (ref 12.0–15.0)
MCH: 31.6 pg (ref 26.0–34.0)
MCHC: 34.3 g/dL (ref 30.0–36.0)
MCV: 92 fL (ref 80.0–100.0)
Platelets: 195 K/uL (ref 150–400)
RBC: 3.64 MIL/uL — ABNORMAL LOW (ref 3.87–5.11)
RDW: 13.3 % (ref 11.5–15.5)
WBC: 15 K/uL — ABNORMAL HIGH (ref 4.0–10.5)
nRBC: 0.1 % (ref 0.0–0.2)

## 2025-01-02 LAB — TYPE AND SCREEN
ABO/RH(D): A POS
Antibody Screen: NEGATIVE

## 2025-01-02 LAB — HIV ANTIBODY (ROUTINE TESTING W REFLEX): HIV Screen 4th Generation wRfx: NONREACTIVE

## 2025-01-02 MED ORDER — TERBUTALINE SULFATE 1 MG/ML IJ SOLN: 0.2500 mg | Freq: Once | INTRAMUSCULAR | Status: DC | PRN

## 2025-01-02 MED ORDER — FERROUS SULFATE 325 (65 FE) MG PO TABS: 325.0000 mg | ORAL_TABLET | Freq: Every day | ORAL | Status: DC

## 2025-01-02 MED ORDER — BENZOCAINE-MENTHOL 20-0.5 % EX AERO
1.0000 | INHALATION_SPRAY | CUTANEOUS | Status: DC | PRN
  Administered 2025-01-02: 1 via TOPICAL
  Filled 2025-01-02 (×2): qty 56

## 2025-01-02 MED ORDER — ACETAMINOPHEN 500 MG PO TABS: 1000.0000 mg | ORAL_TABLET | Freq: Four times a day (QID) | ORAL | Status: DC | PRN

## 2025-01-02 MED ORDER — LIDOCAINE HCL (PF) 1 % IJ SOLN: 30.0000 mL | INTRAMUSCULAR | Status: DC | PRN

## 2025-01-02 MED ORDER — PRENATAL PLUS 27-1 MG PO TABS: 1.0000 | ORAL_TABLET | Freq: Every day | ORAL | Status: DC

## 2025-01-02 MED ORDER — PHENYLEPHRINE 80 MCG/ML (10ML) SYRINGE FOR IV PUSH (FOR BLOOD PRESSURE SUPPORT): 80.0000 ug | PREFILLED_SYRINGE | INTRAVENOUS | Status: DC | PRN

## 2025-01-02 MED ORDER — SIMETHICONE 80 MG PO CHEW: 80.0000 mg | CHEWABLE_TABLET | ORAL | Status: DC | PRN

## 2025-01-02 MED ORDER — IBUPROFEN 600 MG PO TABS
600.0000 mg | ORAL_TABLET | Freq: Four times a day (QID) | ORAL | Status: DC
  Administered 2025-01-03: 600 mg via ORAL
  Filled 2025-01-02: qty 1

## 2025-01-02 MED ORDER — TETANUS-DIPHTH-ACELL PERTUSSIS 5-2-15.5 LF-MCG/0.5 IM SUSP
0.5000 mL | Freq: Once | INTRAMUSCULAR | Status: DC
  Filled 2025-01-02: qty 0.5

## 2025-01-02 MED ORDER — DIPHENHYDRAMINE HCL 50 MG/ML IJ SOLN: 50.0000 mg | INTRAMUSCULAR | Status: DC | PRN

## 2025-01-02 MED ORDER — DIBUCAINE (PERIANAL) 1 % EX OINT
1.0000 | TOPICAL_OINTMENT | CUTANEOUS | Status: DC | PRN
  Administered 2025-01-02: 1 via RECTAL
  Filled 2025-01-02 (×2): qty 28

## 2025-01-02 MED ORDER — MISOPROSTOL 200 MCG PO TABS
ORAL_TABLET | ORAL | Status: AC
  Filled 2025-01-02: qty 4

## 2025-01-02 MED ORDER — ONDANSETRON HCL 4 MG/2ML IJ SOLN: 4.0000 mg | INTRAMUSCULAR | Status: DC | PRN

## 2025-01-02 MED ORDER — LACTATED RINGERS IV SOLN: INTRAVENOUS | Status: DC

## 2025-01-02 MED ORDER — LACTATED RINGERS IV SOLN
500.0000 mL | Freq: Once | INTRAVENOUS | Status: AC
  Administered 2025-01-02: 500 mL via INTRAVENOUS

## 2025-01-02 MED ORDER — SODIUM CHLORIDE 0.9% FLUSH: 3.0000 mL | Freq: Two times a day (BID) | INTRAVENOUS | Status: DC

## 2025-01-02 MED ORDER — PRENATAL MULTIVITAMIN CH
1.0000 | ORAL_TABLET | Freq: Every day | ORAL | Status: DC
  Filled 2025-01-02: qty 1

## 2025-01-02 MED ORDER — SOD CITRATE-CITRIC ACID 500-334 MG/5ML PO SOLN: 30.0000 mL | ORAL | Status: DC | PRN

## 2025-01-02 MED ORDER — COCONUT OIL OIL
1.0000 | TOPICAL_OIL | Status: DC | PRN
  Administered 2025-01-02: 1 via TOPICAL
  Filled 2025-01-02: qty 7.5

## 2025-01-02 MED ORDER — LIDOCAINE HCL (PF) 1 % IJ SOLN
INTRAMUSCULAR | Status: DC | PRN
  Administered 2025-01-02: 3 mL via SUBCUTANEOUS

## 2025-01-02 MED ORDER — OXYTOCIN-SODIUM CHLORIDE 30-0.9 UT/500ML-% IV SOLN: 2.5000 [IU]/h | INTRAVENOUS | Status: DC

## 2025-01-02 MED ORDER — SODIUM CHLORIDE 0.9 % IV SOLN: 250.0000 mL | INTRAVENOUS | Status: DC | PRN

## 2025-01-02 MED ORDER — MEASLES, MUMPS & RUBELLA VAC ~~LOC~~ SUSR
0.5000 mL | Freq: Once | SUBCUTANEOUS | Status: DC
  Filled 2025-01-02: qty 0.5

## 2025-01-02 MED ORDER — AMMONIA AROMATIC IN INHA
RESPIRATORY_TRACT | Status: AC
  Filled 2025-01-02: qty 10

## 2025-01-02 MED ORDER — OXYTOCIN 10 UNIT/ML IJ SOLN
INTRAMUSCULAR | Status: AC
  Filled 2025-01-02: qty 2

## 2025-01-02 MED ORDER — OXYTOCIN-SODIUM CHLORIDE 30-0.9 UT/500ML-% IV SOLN
1.0000 m[IU]/min | INTRAVENOUS | Status: DC
  Administered 2025-01-02: 2 m[IU]/min via INTRAVENOUS
  Filled 2025-01-02: qty 500

## 2025-01-02 MED ORDER — FENTANYL-BUPIVACAINE-NACL 0.5-0.125-0.9 MG/250ML-% EP SOLN
12.0000 mL/h | EPIDURAL | Status: DC | PRN
  Administered 2025-01-02: 12 mL/h via EPIDURAL
  Filled 2025-01-02: qty 250

## 2025-01-02 MED ORDER — ONDANSETRON HCL 4 MG PO TABS: 4.0000 mg | ORAL_TABLET | ORAL | Status: DC | PRN

## 2025-01-02 MED ORDER — DIPHENHYDRAMINE HCL 50 MG/ML IJ SOLN
12.5000 mg | INTRAMUSCULAR | Status: DC | PRN
  Administered 2025-01-02: 12.5 mg via INTRAVENOUS
  Filled 2025-01-02: qty 1

## 2025-01-02 MED ORDER — OLANZAPINE 10 MG IM SOLR
10.0000 mg | Freq: Three times a day (TID) | INTRAMUSCULAR | Status: DC | PRN
  Filled 2025-01-02: qty 10

## 2025-01-02 MED ORDER — HALOPERIDOL LACTATE 5 MG/ML IJ SOLN
5.0000 mg | INTRAMUSCULAR | Status: DC | PRN
  Filled 2025-01-02: qty 1

## 2025-01-02 MED ORDER — HALOPERIDOL LACTATE 5 MG/ML IJ SOLN
10.0000 mg | INTRAMUSCULAR | Status: DC | PRN
  Filled 2025-01-02: qty 2

## 2025-01-02 MED ORDER — DIPHENHYDRAMINE HCL 50 MG/ML IJ SOLN
INTRAMUSCULAR | Status: AC
  Administered 2025-01-02: 100 mg via INTRAMUSCULAR
  Filled 2025-01-02: qty 2

## 2025-01-02 MED ORDER — SODIUM CHLORIDE 0.9% FLUSH: 3.0000 mL | INTRAVENOUS | Status: DC | PRN

## 2025-01-02 MED ORDER — ACETAMINOPHEN 500 MG PO TABS
1000.0000 mg | ORAL_TABLET | Freq: Four times a day (QID) | ORAL | Status: DC
  Administered 2025-01-03: 1000 mg via ORAL
  Filled 2025-01-02: qty 2

## 2025-01-02 MED ORDER — LACTATED RINGERS IV SOLN: 500.0000 mL | INTRAVENOUS | Status: DC | PRN

## 2025-01-02 MED ORDER — OXYTOCIN BOLUS FROM INFUSION
333.0000 mL | Freq: Once | INTRAVENOUS | Status: AC
  Administered 2025-01-02: 333 mL via INTRAVENOUS

## 2025-01-02 MED ORDER — LIDOCAINE HCL (PF) 1 % IJ SOLN
INTRAMUSCULAR | Status: AC
  Filled 2025-01-02: qty 30

## 2025-01-02 MED ORDER — HALOPERIDOL 5 MG PO TABS
5.0000 mg | ORAL_TABLET | ORAL | Status: DC | PRN
  Administered 2025-01-03: 5 mg via ORAL
  Filled 2025-01-02 (×2): qty 1

## 2025-01-02 MED ORDER — FENTANYL CITRATE (PF) 100 MCG/2ML IJ SOLN: 50.0000 ug | INTRAMUSCULAR | Status: DC | PRN

## 2025-01-02 MED ORDER — ONDANSETRON HCL 4 MG/2ML IJ SOLN: 4.0000 mg | Freq: Four times a day (QID) | INTRAMUSCULAR | Status: DC | PRN

## 2025-01-02 MED ORDER — TRANEXAMIC ACID-NACL 1000-0.7 MG/100ML-% IV SOLN
1000.0000 mg | INTRAVENOUS | Status: AC
  Administered 2025-01-02: 1000 mg via INTRAVENOUS
  Filled 2025-01-02: qty 100

## 2025-01-02 MED ORDER — SODIUM CHLORIDE 0.9 % IV SOLN
INTRAVENOUS | Status: DC | PRN
  Administered 2025-01-02: 5 mL via EPIDURAL
  Administered 2025-01-02: 3 mL via EPIDURAL

## 2025-01-02 MED ORDER — DIPHENHYDRAMINE HCL 50 MG/ML IJ SOLN: 100.0000 mg | Freq: Once | INTRAMUSCULAR | Status: AC

## 2025-01-02 MED ORDER — MAGNESIUM HYDROXIDE 400 MG/5ML PO SUSP
30.0000 mL | ORAL | Status: DC | PRN
  Filled 2025-01-02: qty 30

## 2025-01-02 MED ORDER — QUETIAPINE FUMARATE 300 MG PO TABS
300.0000 mg | ORAL_TABLET | Freq: Every day | ORAL | Status: DC
  Administered 2025-01-02: 300 mg via ORAL
  Filled 2025-01-02 (×2): qty 1

## 2025-01-02 MED ORDER — DOCUSATE SODIUM 100 MG PO CAPS: 100.0000 mg | ORAL_CAPSULE | Freq: Two times a day (BID) | ORAL | Status: DC

## 2025-01-02 MED ORDER — EPHEDRINE 5 MG/ML INJ: 10.0000 mg | INTRAVENOUS | Status: DC | PRN

## 2025-01-02 MED ORDER — WITCH HAZEL-GLYCERIN EX PADS
1.0000 | MEDICATED_PAD | CUTANEOUS | Status: DC | PRN
  Administered 2025-01-02: 1 via TOPICAL
  Filled 2025-01-02 (×2): qty 100

## 2025-01-02 MED ORDER — LIDOCAINE-EPINEPHRINE (PF) 1.5 %-1:200000 IJ SOLN
INTRAMUSCULAR | Status: DC | PRN
  Administered 2025-01-02: 4 mL via EPIDURAL

## 2025-01-02 MED ORDER — HYDROXYZINE HCL 25 MG PO TABS: 50.0000 mg | ORAL_TABLET | Freq: Four times a day (QID) | ORAL | Status: DC | PRN

## 2025-01-02 NOTE — BHH Counselor (Signed)
 CSW, along with the midwife, RN, mental health technician, and OB/GYN team, met with the patient to engage in safety planning in preparation for her induction.  The patient initially presented as agitated and resistant. She was focused on discharge and became increasingly irate but was able to be calmed after brief redirection.  CSW assessed the patients orientation. The patient was able to correctly identify the current year, location, president, and her birth year. She appeared alert and oriented 3. The patient provided written consent for the team to communicate and coordinate care with her mother.  The patient later became calmer and additionally provided verbal consent for her mother to be contacted.   CSW to continue to assess.   [name], MSW, LCSWA 01/02/2025 12:44 PM

## 2025-01-02 NOTE — Group Note (Signed)
 Date:  01/02/2025 Time:  11:56 AM  Group Topic/Focus:  Goals Group:   The focus of this group is to help patients establish daily goals to achieve during treatment and discuss how the patient can incorporate goal setting into their daily lives to aide in recovery.    Participation Level:  Active  Participation Quality:  Appropriate  Affect:  Appropriate  Cognitive:  Appropriate  Insight: Appropriate  Engagement in Group:  Engaged  Modes of Intervention:  Activity  Additional Comments:    [place] DELENA June 01/02/2025, 11:56 AM

## 2025-01-02 NOTE — Progress Notes (Signed)
 Labor Progress Note   ASSESSMENT/PLAN   Eileen Moody 29 y.o.   [id number]  at [redacted]w[redacted]d admited for IOL d/t GHTN. Current IVC for psychosis & schizophrenia. AROM performed for moderate amount clear fluid.  FWB:  - Fetal well being assessed: Category 1        GBS: - GBS: negative - Prophylaxis: Not indicated  LABOR: - Now in early labor, doing well. - Pain Management: epidural in place, working well - Discussed options with patient and will perform AROM and continue pitocin  titration. - Anticipate SVD   Principal Problem:   Labor and delivery, indication for care     SUBJECTIVE/OBJECTIVE   SUBJECTIVE: Comfortable with epidural. Calm, alert & oriented to situation and in agreement with AROM. Mother & sister at bedside supportive.    OBJECTIVE: Vital Signs: Patient Vitals for the past 12 hrs:  BP Temp Temp src Pulse Resp SpO2 Height Weight  01/02/25 1650 -- -- -- -- -- 99 % -- --  01/02/25 1645 -- -- -- -- -- 99 % -- --  01/02/25 1640 -- -- -- -- -- 99 % -- --  01/02/25 1635 -- -- -- -- -- 99 % -- --  01/02/25 1630 123/85 -- -- (!) 110 -- 99 % -- --  01/02/25 1625 -- -- -- -- -- 97 % -- --  01/02/25 1620 -- -- -- -- -- 100 % -- --  01/02/25 1615 -- -- -- -- -- 100 % -- --  01/02/25 1610 -- -- -- -- -- 100 % -- --  01/02/25 1605 -- -- -- -- -- 100 % -- --  01/02/25 1600 -- -- -- -- -- 100 % -- --  01/02/25 1555 -- -- -- -- -- 100 % -- --  01/02/25 1550 -- -- -- -- -- 99 % -- --  01/02/25 1545 -- -- -- -- -- 99 % -- --  01/02/25 1540 -- -- -- -- -- 99 % -- --  01/02/25 1535 -- -- -- -- -- 99 % -- --  01/02/25 1530 -- -- -- -- -- 99 % -- --  01/02/25 1525 -- -- -- -- -- 98 % -- --  01/02/25 1520 -- -- -- -- -- 98 % -- --  01/02/25 1515 115/82 -- -- (!) 113 -- 98 % -- --  01/02/25 1510 -- -- -- -- -- 98 % -- --  01/02/25 1505 -- -- -- -- -- 98 % -- --  01/02/25 1504 -- -- -- -- -- 98 % -- --  01/02/25 1500 116/78 -- -- 90 -- 98 % -- --  01/02/25 1455 -- -- -- -- -- 98 %  -- --  01/02/25 1450 -- -- -- -- -- 98 % -- --  01/02/25 1445 118/66 -- -- (!) 105 -- 98 % -- --  01/02/25 1440 -- -- -- -- -- 99 % -- --  01/02/25 1435 -- -- -- -- -- 99 % -- --  01/02/25 1430 129/66 -- -- (!) 101 16 99 % -- --  01/02/25 1425 -- -- -- -- -- 99 % -- --  01/02/25 1420 -- -- -- -- -- 98 % -- --  01/02/25 1415 133/72 -- -- (!) 112 -- 98 % -- --  01/02/25 1411 (!) 122/95 -- -- (!) 108 -- -- -- --  01/02/25 1410 -- -- -- -- -- 99 % -- --  01/02/25 1405 (!) 117/58 -- -- (!) 116 -- 99 % -- --  01/02/25  1401 117/72 -- -- (!) 113 -- -- -- --  01/02/25 1400 -- -- -- -- -- 99 % -- --  01/02/25 1355 134/76 -- -- (!) 106 -- 100 % -- --  01/02/25 1353 (!) 142/86 -- -- (!) 108 -- -- -- --  01/02/25 1350 -- -- -- -- -- 98 % -- --  01/02/25 1345 -- -- -- -- -- 98 % -- --  01/02/25 1340 -- -- -- -- -- 97 % -- --  01/02/25 1251 -- -- -- -- -- -- 5' 5 (1.651 m) 74.8 kg  01/02/25 1229 132/86 98.1 F (36.7 C) Oral (!) 110 18 -- -- --    Last SVE:  Dilation: 4 (01/02/25 1645) Effacement (%): 70 (01/02/25 1645) Cervical Position: Anterior (01/02/25 1645) Station: -2 (01/02/25 1645) Presentation: Vertex (01/02/25 1645) Exam by:: Harlene A. , CNM (01/02/25 1645)  Membranes Sac Identifier: Sac 1 Membrane Status: AROM Rupture Date: 01/02/25 Rupture Time: 1645 Color: Clear Odor: Normal Amount: Moderate  FHR:   - Mode: External  - Baseline Rate (A): 130 bpm  - Variability: Moderate  - Characteristics (ie - accels, decels): Accelerations: 15 x 15, Decelerations: Variable  UTERINE ACTIVITY:   - Mode: Toco  - Contraction Frequency (min): 6-12 minutes  Pitocin : 10

## 2025-01-02 NOTE — Progress Notes (Signed)
 SPIRITUAL CARE AND COUNSELING CONSULT NOTE   VISIT SUMMARY  Chaplin asked to assist and support family and BH patient while in labor and delivery. Spent time with mother to gain deeper knowledge and understanding regarding daughters encounter.  SPIRITUAL ENCOUNTER                                                                                                                                                                      Type of Visit: Initial Care provided to:: Family Reason for visit: Routine spiritual support   SPIRITUAL FRAMEWORK  Presenting Themes: Coping tools Community/Connection: Family, Friend(s), Significant other Patient Stress Factors: Other (Comment) Family Stress Factors: Exhausted, Family relationships, Major life changes   GOALS       INTERVENTIONS   Spiritual Care Interventions Made: Reflective listening, Reconciliation with self/others    INTERVENTION OUTCOMES      SPIRITUAL CARE PLAN        If immediate needs arise,    Byrne Capek  01/02/2025 3:40 PM

## 2025-01-02 NOTE — Progress Notes (Signed)
 Patient is agitated is focused on discharge. I need to leave now at 9 am I need to discharged I don't want to miss my court date in [place]. I will come back after two weeks. Patient trying to call her family to come and pick her up. I am not getting V/S my Babies are ok I need to leave. Support ongoing 1:1 sitter with Patient. Patient continue to pace.

## 2025-01-02 NOTE — BHH Counselor (Addendum)
 CSW spoke with Nia with [facility], [phone].  CSW followed up on plans for patient's mother to get guardianship of the patient.  Per Nia the mother has completed and filed the paperwork, however she is unsure of the outcome.  She reports that she plans on meeting with the mother on 01/09/2024 to discuss.  She recommends that CSW follow up with mother for additional details.   CSW notes that at this time there is not a note entered indicating that patient lacks capacity and collateral contact is needed.    Sherryle Margo, MSW, LCSW 01/02/2025 8:38 AM

## 2025-01-02 NOTE — Anesthesia Procedure Notes (Signed)
 Epidural Patient location during procedure: OB End time: 01/02/2025 1:56 PM  Staffing Performed: anesthesiologist   Preanesthetic Checklist Completed: patient identified, IV checked, site marked, risks and benefits discussed, surgical consent, monitors and equipment checked, pre-op evaluation and timeout performed  Epidural Patient position: sitting Prep: Betadine Patient monitoring: heart rate, continuous pulse ox and blood pressure Approach: midline Location: L4-L5 Injection technique: LOR saline  Needle:  Needle type: Tuohy  Needle gauge: 17 G Needle length: 9 cm and 9 Needle insertion depth: 8 cm Catheter type: closed end flexible Catheter size: 19 Gauge Catheter at skin depth: 14 cm Test dose: negative and 1.5% lidocaine  with Epi 1:200 K  Assessment Events: blood not aspirated, no cerebrospinal fluid, injection not painful, no injection resistance, no paresthesia and negative IV test  Additional Notes   Patient tolerated the insertion well without complications.Reason for block:procedure for pain

## 2025-01-02 NOTE — Progress Notes (Signed)
 SPIRITUAL CARE AND COUNSELING CONSULT NOTE   VISIT SUMMARY Chaplain stayed near nursing unit to offer support and care.   SPIRITUAL ENCOUNTER                                                                                                                                                                      Type of Visit: Follow up Care provided to:: Pt and family Conversation partners present during encounter: Nurse, Other (comment) Referral source: Nurse (RN/NT/LPN), Family Reason for visit: Routine spiritual support OnCall Visit: No   SPIRITUAL FRAMEWORK  Presenting Themes: Courage hope and growth, Coping tools Community/Connection: Family, Friend(s), Significant other Patient Stress Factors: Family relationships Family Stress Factors: Major life changes   GOALS       INTERVENTIONS   Spiritual Care Interventions Made: Reflective listening, Reconciliation with self/others    INTERVENTION OUTCOMES      SPIRITUAL CARE PLAN        If immediate needs arise,    Eileen Moody  01/02/2025 4:56 PM

## 2025-01-02 NOTE — [facility] Suicide Risk Assessment (Signed)
 [facility] INPATIENT:  Family/Significant Other Suicide Prevention Education  Suicide Prevention Education:  Education Completed; Reata Petrov, [phone], Mother, has been identified by the patient as the family member/significant other with whom the patient will be residing, and identified as the person(s) who will aid the patient in the event of a mental health crisis (suicidal ideations/suicide attempt).  With written consent from the patient, the family member/significant other has been provided the following suicide prevention education, prior to the and/or following the discharge of the patient.  The suicide prevention education provided includes the following: Suicide risk factors Suicide prevention and interventions National Suicide Hotline telephone number [facility] assessment telephone number [facility] Emergency Assistance [address] [facility] and/or Residential Mobile Crisis Unit telephone number  Request made of family/significant other to: Remove weapons (e.g., guns, rifles, knives), all items previously/currently identified as safety concern.   Remove drugs/medications (over-the-counter, prescriptions, illicit drugs), all items previously/currently identified as a safety concern.  The family member/significant other verbalizes understanding of the suicide prevention education information provided.  The family member/significant other agrees to remove the items of safety concern listed above.  Alveta CHRISTELLA Kerns 01/02/2025, 12:47 PM

## 2025-01-02 NOTE — Anesthesia Preprocedure Evaluation (Signed)
"                                    Anesthesia Evaluation  Patient identified by MRN, date of birth, ID band Patient awake    Reviewed: Allergy & Precautions, H&P , NPO status , Patient's Chart, lab work & pertinent test results, reviewed documented beta blocker date and time   Airway Mallampati: II  TM Distance: >3 FB Neck ROM: full    Dental no notable dental hx. (+) Teeth Intact   Pulmonary neg pulmonary ROS, Current Smoker   Pulmonary exam normal breath sounds clear to auscultation       Cardiovascular Exercise Tolerance: Good negative cardio ROS  Rhythm:regular Rate:Normal     Neuro/Psych   Anxiety Depression Bipolar Disorder Schizophrenia  negative neurological ROS  negative psych ROS   GI/Hepatic negative GI ROS, Neg liver ROS,,,  Endo/Other  negative endocrine ROSdiabetes, Well Controlled, Gestational    Renal/GU      Musculoskeletal   Abdominal   Peds  Hematology  (+) Blood dyscrasia, anemia   Anesthesia Other Findings   Reproductive/Obstetrics (+) Pregnancy                              Anesthesia Physical Anesthesia Plan  ASA: 3  Anesthesia Plan: Epidural   Post-op Pain Management:    Induction:   PONV Risk Score and Plan:   Airway Management Planned:   Additional Equipment:   Intra-op Plan:   Post-operative Plan:   Informed Consent: I have reviewed the patients History and Physical, chart, labs and discussed the procedure including the risks, benefits and alternatives for the proposed anesthesia with the patient or authorized representative who has indicated his/her understanding and acceptance.       Plan Discussed with:   Anesthesia Plan Comments:         Anesthesia Quick Evaluation  "

## 2025-01-02 NOTE — Progress Notes (Signed)
 Two-Physician Attestation of Incapacity for Medical Decision-Making   Patient Name: Eileen Moody MRN: [id number] Date/Time: 01/02/25 at 1442   Attestation Purpose:  This attestation is made to document that the above-named patient, currently under an involuntary psychiatric commitment, lacks the capacity to make informed medical decisions regarding her labor and delivery care, and that prompt clinical decisions and treatment are necessary for her health and safety.    I. Clinical Findings of Incapcity: I, the undersigned licensed physician, individually and independently determine that: The patient currently lacks the capacity to understand, appreciate, and make informed decisions regarding her medical care, including the nature, risks, benefits, and alternatives of proposed labor and delivery treatments.  This determination is based on clinical evaluation of the patient's: Mental status Ability to comprehend and communicate Insight into her medical condition and proposed care Consistency of choice or understanding The incapacity is due to her acute psychotic state in the context of involuntary psychiatric commitment. The patient is unable to provide informed consent due to her mental condition and cognitive incapacity   II. Necessity of Medical Decision-Making The patient requires ongoing evaluation and treatment related to labor and delivery that cannot be safely delayed.  In my medical judgement, delay or failure to provide necessary care could endanger the patient's life or significantly worsen her condition, including risks to maternal and fetal health.      Attestation:  To the best of my medical knowledge and to a reasonable degree of medical certainty, the information contained in this attestation accurately reflects the patient's incapacity and the clinical necessity for medical decision-making on her behalf.
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
## 2025-01-02 NOTE — Progress Notes (Signed)
 Patient discharge to L&D via wheel chair accompanied by Staff, Mother and L&D Staff alert and oriented. Patient is calm time of discharge encouragement provided and Patient was compliant. Support ongoing.

## 2025-01-02 NOTE — Group Note (Signed)
 Recreation Therapy Group Note   Group Topic:Leisure Education  Group Date: 01/02/2025 Start Time: 1030 End Time: 1140 Facilitators: Celestia Jeoffrey BRAVO, LRT, CTRS Location: Craft Room  Group Description: Leisure. Patients were given the option to choose from journaling, coloring, drawing, making origami, playing with playdoh, listening to music or singing karaoke. LRT and pts discussed the meaning of leisure, the importance of participating in leisure during their free time/when they're outside of the hospital, as well as how our leisure interests can also serve as coping skills.   Goal Area(s) Addressed:  Patient will identify a current leisure interest.  Patient will learn the definition of leisure. Patient will practice making a positive decision. Patient will have the opportunity to try a new leisure activity. Patient will communicate with peers and LRT.    Affect/Mood: Appropriate   Participation Level: Active and Engaged   Participation Quality: Independent   Behavior: Cooperative   Speech/Thought Process: Loose association   Insight: Fair   Judgement: Fair    Modes of Intervention: Clarification, Education, Exploration, Music, and Socialization   Patient Response to Interventions:  Receptive   Education Outcome:  In group clarification offered    Clinical Observations/Individualized Feedback: Eileen Moody was mostly active in their participation of session activities and group discussion. Pt came late to group and stood by the doorway. After, 10 minutes and encouragement from LRT, pt sat down with peers and engaged. Pt was noted to be singing along to the music and speaking with peers.    Plan: Continue to engage patient in RT group sessions 2-3x/week.   Jeoffrey BRAVO Celestia, LRT, CTRS 01/02/2025 11:47 AM

## 2025-01-02 NOTE — Progress Notes (Addendum)
" °  [facility] Adult Case Management Discharge Plan :  Will you be returning to the same living situation after discharge:  Patient taken to medical floor to deliver.  At discharge, do you have transportation home?: Patient taken to medical floor to deliver.  Do you have the ability to pay for your medications: Yes,   [employer] / [employer]  Release of information consent forms completed and in the chart;  Patient's signature needed at discharge.  Patient to Follow up at:  Follow-up Information     [facility] Follow up.   Why: Clinic Hours:  Monday - Friday: 8:00 a.m. - 5:00 p.m.   Walk-in assessments are available Monday, Wednesday, and Friday from 8:00 a.m. - 3:30 p.m.   You do not need an appointment during these times -- new patients may come in for same-day assessment and service connection.   If you experience a mental health or substance use crisis outside normal business hours, you may go to:   [facility] ([facility])  [address], [place], [place] [address]   Open 24 hours a day, 7 days a week for behavioral health emergencies.   If you feel unsafe, are having thoughts of self-harm, or are in danger, call 911 or go to the nearest emergency department.   You can also reach the 988 Suicide & Crisis Lifeline by calling or texting 988 any time. Contact information: [address] [place] [address] [phone]                 Next level of care provider has access to [facility] Link:yes  Safety Planning and Suicide Prevention discussed: Yes, Eileen Moody,  [phone] Mother  Has patient been referred to the Quitline?: Patient refused referral for treatment  Patient has been referred for addiction treatment: No known substance use disorder.  Eileen CHRISTELLA Kerns, LCSW 01/02/2025, 12:46 PM "

## 2025-01-02 NOTE — Discharge Summary (Signed)
 "    Postpartum Discharge Summary  Date of Service updated***     Patient Name: Eileen Moody DOB: Feb 03, 1996 MRN: [id number]  Date of admission: 01/02/2025 Delivery date:01/02/2025 Delivering provider: JAYNE HARLENE CROME Date of discharge: 01/02/2025  Admitting diagnosis: Labor and delivery, indication for care [O75.9] Intrauterine pregnancy: [redacted]w[redacted]d     Secondary diagnosis:  Active Problems:   Schizophrenia, paranoid (HCC)   Gestational hypertension   Postpartum care following vaginal delivery  Additional problems: ***    Discharge diagnosis: Term Pregnancy Delivered and Gestational Hypertension                                              Post partum procedures:none Augmentation: AROM and Pitocin  Complications: None  Hospital course: Induction of Labor With Vaginal Delivery   29 y.o. yo [id number] at [redacted]w[redacted]d was admitted to the hospital 01/02/2025 for induction of labor.  Indication for induction: Gestational hypertension, active psychosis/schizophrenia.  Patient had an labor course complicated by none Membrane Rupture Time/Date: 4:45 PM,01/02/2025  Delivery Method:Vaginal, Spontaneous Operative Delivery:N/A Episiotomy: None Lacerations:  None Details of delivery can be found in separate delivery note.  Patient had a postpartum course complicated by none. Patient is discharged to return to inpatient [facility] 01/02/25.  Newborn Data: Birth date:01/02/2025 Birth time:8:11 PM Gender:Female Living status:Living Apgars:8 ,9  Weight:2700 g  Magnesium  Sulfate received: No BMZ received: No Rhophylac:N/A MMR:{MMR:30440033} T-DaP:{Tdap:23962} Flu: {chl tdap given:23962} RSV Vaccine received: No Transfusion:No Immunizations administered: Immunization History  Administered Date(s) Administered   MMR 10/23/2017    Recommend 6 weeks of prophylactic anticoagulation with LMWH or subcutaneous unfractionated heparin if 1 or more high risk factor is present.  Recommend 14 days of prophylactic  anticoagulation with LMWH or subcutaneous unfractionated heparin if 3 or more moderate risk factors are present.   Risk assessment for postpartum VTE and prophylactic treatment:  High risk factors: None Moderate risk factors: None  Postpartum VTE prophylaxis with LMWH not indicated   Physical exam  Vitals:   01/02/25 2110 01/02/25 2115 01/02/25 2120 01/02/25 2125  BP:      Pulse:      Resp:      Temp:      TempSrc:      SpO2: 99% 100% 100% 100%  Weight:      Height:       General: {Exam; general:21111117} Lochia: {Desc; appropriate/inappropriate:30686::appropriate} Uterine Fundus: {Desc; firm/soft:30687} Incision: {Exam; incision:21111123} DVT Evaluation: {Exam; dvt:2111122} Labs: Lab Results  Component Value Date   WBC 15.0 (H) 01/02/2025   HGB 11.5 (L) 01/02/2025   HCT 33.5 (L) 01/02/2025   MCV 92.0 01/02/2025   PLT 195 01/02/2025      Latest Ref Rng & Units 01/02/2025    8:51 AM  CMP  Glucose 70 - 99 mg/dL 839   BUN 6 - 20 mg/dL 9   Creatinine 9.55 - 8.99 mg/dL 9.33   Sodium 864 - 854 mmol/L 138   Potassium 3.5 - 5.1 mmol/L 4.0   Chloride 98 - 111 mmol/L 107   CO2 22 - 32 mmol/L 19   Calcium  8.9 - 10.3 mg/dL 9.1   Total Protein 6.5 - 8.1 g/dL 6.8   Total Bilirubin 0.0 - 1.2 mg/dL 0.2   Alkaline Phos 38 - 126 U/L 111   AST 15 - 41 U/L 23   ALT 0 - 44 U/L 20  Edinburgh Score:     No data to display             After visit meds:  Allergies as of 01/02/2025   No Known Allergies   Med Rec must be completed prior to using this [facility]***        Discharge to return to inpatient Behavioral Health Unit in stable condition Infant Feeding: Bottle Infant Disposition:NICU Discharge instruction: per After Visit Summary and Postpartum booklet. Activity: Advance as tolerated. Pelvic rest for 6 weeks.  Diet: routine diet Anticipated Birth Control: Unsure Postpartum VTE prophyhlaxis: Not Indicated Postpartum Appointment:Will continue to follow  while inpatient in [facility] Future Appointments:No future appointments. Follow up Visit:      01/02/2025 Harlene LITTIE Cisco, CNM  "

## 2025-01-02 NOTE — Progress Notes (Signed)
 Two-Physician Attestation of Incapacity for Medical Decision-Making  Patient Name: Eileen Moody MRN: [id number] Date/Time: 01/02/25 at 9:42 AM  Attestation Purpose:  This attestation is made to document that the above-named patient, currently under an involuntary psychiatric commitment, lacks the capacity to make informed medical decisions regarding her labor and delivery care, and that prompt clinical decisions and treatment are necessary for her health and safety.   I. Clinical Findings of Incapcity: I, the undersigned licensed physician, individually and independently determine that: The patient currently lacks the capacity to understand, appreciate, and make informed decisions regarding her medical care, including the nature, risks, benefits, and alternatives of proposed labor and delivery treatments.  This determination is based on clinical evaluation of the patient's: Mental status Ability to comprehend and communicate Insight into her medical condition and proposed care Consistency of choice or understanding The incapacity is due to her acute psychotic state in the context of involuntary psychiatric commitment. The patient is unable to provide informed consent due to her mental condition and cognitive incapacity  II. Necessity of Medical Decision-Making The patient requires ongoing evaluation and treatment related to labor and delivery that cannot be safely delayed.  In my medical judgement, delay or failure to provide necessary care could endanger the patient's life or significantly worsen her condition, including risks to maternal and fetal health.    Attestation:  To the best of my medical knowledge and to a reasonable degree of medical certainty, the information contained in this attestation accurately reflects the patient's incapacity and the clinical necessity for medical decision-making on her behalf.    Dr. Estil Mangle [place] [facility] at [facility]  License  #: [id number] 01/02/25, 9:52 AM

## 2025-01-02 NOTE — Progress Notes (Signed)
 Labor Progress Note   ASSESSMENT/PLAN   Alzada Brazee 29 y.o.   [id number]  at [redacted]w[redacted]d admited for IOL for GHTN in setting of IVC for psychosis/schizophrenia.  FWB:  - Fetal well being assessed: Category 2        GBS: - GBS: negative - Prophylaxis: Not indicated  LABOR: - Now in early active labor, doing well. - Pain Management: epidural in place - Discussed options with patient and will continue pitocin  titration. - Anticipate SVD   Principal Problem:   Labor and delivery, indication for care     SUBJECTIVE/OBJECTIVE   SUBJECTIVE: Feeling pressure with contractions, would like SVE.    OBJECTIVE: Vital Signs: Patient Vitals for the past 12 hrs:  BP Temp Temp src Pulse Resp SpO2 Height Weight  01/02/25 1730 117/68 -- -- 92 -- 98 % -- --  01/02/25 1650 -- -- -- -- -- 99 % -- --  01/02/25 1645 -- -- -- -- -- 99 % -- --  01/02/25 1640 -- -- -- -- -- 99 % -- --  01/02/25 1635 -- -- -- -- -- 99 % -- --  01/02/25 1630 123/85 -- -- (!) 110 -- 99 % -- --  01/02/25 1625 -- -- -- -- -- 97 % -- --  01/02/25 1620 -- -- -- -- -- 100 % -- --  01/02/25 1615 -- -- -- -- -- 100 % -- --  01/02/25 1610 -- -- -- -- -- 100 % -- --  01/02/25 1605 -- -- -- -- -- 100 % -- --  01/02/25 1600 -- -- -- -- -- 100 % -- --  01/02/25 1555 -- -- -- -- -- 100 % -- --  01/02/25 1550 -- -- -- -- -- 99 % -- --  01/02/25 1545 -- -- -- -- -- 99 % -- --  01/02/25 1540 -- -- -- -- -- 99 % -- --  01/02/25 1535 -- -- -- -- -- 99 % -- --  01/02/25 1530 -- -- -- -- -- 99 % -- --  01/02/25 1525 -- -- -- -- -- 98 % -- --  01/02/25 1520 -- -- -- -- -- 98 % -- --  01/02/25 1515 115/82 -- -- (!) 113 -- 98 % -- --  01/02/25 1510 -- -- -- -- -- 98 % -- --  01/02/25 1505 -- -- -- -- -- 98 % -- --  01/02/25 1504 -- -- -- -- -- 98 % -- --  01/02/25 1500 116/78 -- -- 90 -- 98 % -- --  01/02/25 1455 -- -- -- -- -- 98 % -- --  01/02/25 1450 -- -- -- -- -- 98 % -- --  01/02/25 1445 118/66 -- -- (!) 105 -- 98 % -- --   01/02/25 1440 -- -- -- -- -- 99 % -- --  01/02/25 1435 -- -- -- -- -- 99 % -- --  01/02/25 1430 129/66 -- -- (!) 101 16 99 % -- --  01/02/25 1425 -- -- -- -- -- 99 % -- --  01/02/25 1420 -- -- -- -- -- 98 % -- --  01/02/25 1415 133/72 -- -- (!) 112 -- 98 % -- --  01/02/25 1411 (!) 122/95 -- -- (!) 108 -- -- -- --  01/02/25 1410 -- -- -- -- -- 99 % -- --  01/02/25 1405 (!) 117/58 -- -- (!) 116 -- 99 % -- --  01/02/25 1401 117/72 -- -- (!) 113 -- -- -- --  01/02/25  1400 -- -- -- -- -- 99 % -- --  01/02/25 1355 134/76 -- -- (!) 106 -- 100 % -- --  01/02/25 1353 (!) 142/86 -- -- (!) 108 -- -- -- --  01/02/25 1350 -- -- -- -- -- 98 % -- --  01/02/25 1345 -- -- -- -- -- 98 % -- --  01/02/25 1340 -- -- -- -- -- 97 % -- --  01/02/25 1251 -- -- -- -- -- -- 5' 5 (1.651 m) 74.8 kg  01/02/25 1229 132/86 98.1 F (36.7 C) Oral (!) 110 18 -- -- --    Last SVE:  Dilation: 5 (01/02/25 1757) Effacement (%): 70 (01/02/25 1757) Cervical Position: Anterior (01/02/25 1757) Station: -2 (01/02/25 1757) Presentation: Vertex (01/02/25 1757) Exam by:: Jayne (01/02/25 1757)  Membranes Sac Identifier: Sac 1 Membrane Status: AROM Rupture Date: 01/02/25 Rupture Time: 1645 Color: Clear Odor: Normal Amount: Moderate  FHR:   - Mode: External  - Baseline Rate (A): 130 bpm  - Variability: Moderate  - Characteristics (ie - accels, decels): Accelerations: 15 x 15, Decelerations: Variable  UTERINE ACTIVITY:   - Mode: Toco  - Contraction Frequency (min): 6-12 minutes  Pitocin : 12

## 2025-01-03 ENCOUNTER — Inpatient Hospital Stay: Admit: 2025-01-03 | Payer: MEDICAID | Admitting: Psychiatry

## 2025-01-03 ENCOUNTER — Inpatient Hospital Stay
Admission: AD | Admit: 2025-01-03 | Discharge: 2025-01-06 | DRG: 776 | Disposition: A | Discharge status: Home / Self Care | Payer: MEDICAID | Attending: Psychiatry | Admitting: Psychiatry

## 2025-01-03 ENCOUNTER — Other Ambulatory Visit: Payer: Self-pay

## 2025-01-03 DIAGNOSIS — O99345 Other mental disorders complicating the puerperium: Secondary | ICD-10-CM | POA: Diagnosis present

## 2025-01-03 DIAGNOSIS — O99335 Smoking (tobacco) complicating the puerperium: Secondary | ICD-10-CM | POA: Diagnosis present

## 2025-01-03 DIAGNOSIS — F17210 Nicotine dependence, cigarettes, uncomplicated: Secondary | ICD-10-CM | POA: Diagnosis present

## 2025-01-03 DIAGNOSIS — F29 Unspecified psychosis not due to a substance or known physiological condition: Secondary | ICD-10-CM | POA: Diagnosis not present

## 2025-01-03 DIAGNOSIS — F250 Schizoaffective disorder, bipolar type: Secondary | ICD-10-CM | POA: Diagnosis present

## 2025-01-03 LAB — CBC
HCT: 36.7 % (ref 36.0–46.0)
Hemoglobin: 12.4 g/dL (ref 12.0–15.0)
MCH: 31.4 pg (ref 26.0–34.0)
MCHC: 33.8 g/dL (ref 30.0–36.0)
MCV: 92.9 fL (ref 80.0–100.0)
Platelets: 223 K/uL (ref 150–400)
RBC: 3.95 MIL/uL (ref 3.87–5.11)
RDW: 13.7 % (ref 11.5–15.5)
WBC: 14.4 K/uL — ABNORMAL HIGH (ref 4.0–10.5)
nRBC: 0 % (ref 0.0–0.2)

## 2025-01-03 LAB — SYPHILIS: RPR W/REFLEX TO RPR TITER AND TREPONEMAL ANTIBODIES, TRADITIONAL SCREENING AND DIAGNOSIS ALGORITHM: RPR Ser Ql: NONREACTIVE

## 2025-01-03 MED ORDER — IBUPROFEN 600 MG PO TABS
600.0000 mg | ORAL_TABLET | Freq: Four times a day (QID) | ORAL | Status: DC
  Administered 2025-01-03 – 2025-01-06 (×11): 600 mg via ORAL
  Filled 2025-01-03 (×11): qty 1

## 2025-01-03 MED ORDER — DIPHENHYDRAMINE HCL 25 MG PO CAPS
ORAL_CAPSULE | ORAL | Status: AC
  Filled 2025-01-03: qty 2

## 2025-01-03 MED ORDER — HALOPERIDOL LACTATE 5 MG/ML IJ SOLN: 5.0000 mg | INTRAMUSCULAR | Status: DC | PRN

## 2025-01-03 MED ORDER — QUETIAPINE FUMARATE 200 MG PO TABS: 300.0000 mg | ORAL_TABLET | Freq: Every day | ORAL | Status: DC

## 2025-01-03 MED ORDER — ONDANSETRON HCL 4 MG/2ML IJ SOLN
4.0000 mg | INTRAMUSCULAR | Status: DC | PRN
  Filled 2025-01-03: qty 2

## 2025-01-03 MED ORDER — NICOTINE 21 MG/24HR TD PT24
21.0000 mg | MEDICATED_PATCH | Freq: Every day | TRANSDERMAL | Status: DC
  Administered 2025-01-03 – 2025-01-06 (×4): 21 mg via TRANSDERMAL
  Filled 2025-01-03 (×4): qty 1

## 2025-01-03 MED ORDER — DOCUSATE SODIUM 100 MG PO CAPS
100.0000 mg | ORAL_CAPSULE | Freq: Two times a day (BID) | ORAL | Status: DC
  Administered 2025-01-03 – 2025-01-06 (×6): 100 mg via ORAL
  Filled 2025-01-03 (×6): qty 1

## 2025-01-03 MED ORDER — ACETAMINOPHEN 325 MG PO TABS
650.0000 mg | ORAL_TABLET | Freq: Four times a day (QID) | ORAL | Status: DC | PRN
  Administered 2025-01-03: 650 mg via ORAL
  Filled 2025-01-03: qty 2

## 2025-01-03 MED ORDER — IBUPROFEN 600 MG PO TABS
600.0000 mg | ORAL_TABLET | Freq: Four times a day (QID) | ORAL | 0 refills | Status: DC
  Filled 2025-01-03: qty 30, 8d supply, fill #0

## 2025-01-03 MED ORDER — QUETIAPINE FUMARATE 200 MG PO TABS
300.0000 mg | ORAL_TABLET | Freq: Every day | ORAL | Status: DC
  Administered 2025-01-04 – 2025-01-05 (×3): 300 mg via ORAL
  Filled 2025-01-03 (×3): qty 1

## 2025-01-03 MED ORDER — COCONUT OIL OIL: 1.0000 | TOPICAL_OIL | Status: DC | PRN

## 2025-01-03 MED ORDER — ACETAMINOPHEN 500 MG PO TABS: 1000.0000 mg | ORAL_TABLET | Freq: Four times a day (QID) | ORAL | Status: DC | PRN

## 2025-01-03 MED ORDER — ACETAMINOPHEN 500 MG PO TABS
1000.0000 mg | ORAL_TABLET | Freq: Four times a day (QID) | ORAL | Status: DC
  Administered 2025-01-03 – 2025-01-06 (×11): 1000 mg via ORAL
  Filled 2025-01-03 (×12): qty 2

## 2025-01-03 MED ORDER — PRENATAL MULTIVITAMIN CH
1.0000 | ORAL_TABLET | Freq: Every day | ORAL | Status: DC
  Administered 2025-01-04 – 2025-01-06 (×3): 1 via ORAL
  Filled 2025-01-03 (×3): qty 1

## 2025-01-03 MED ORDER — DIBUCAINE (PERIANAL) 1 % EX OINT
1.0000 | TOPICAL_OINTMENT | CUTANEOUS | Status: DC | PRN
  Filled 2025-01-03 (×2): qty 28

## 2025-01-03 MED ORDER — ONDANSETRON HCL 4 MG PO TABS
4.0000 mg | ORAL_TABLET | ORAL | Status: DC | PRN
  Filled 2025-01-03: qty 1

## 2025-01-03 MED ORDER — BENZOCAINE-MENTHOL 20-0.5 % EX AERO
1.0000 | INHALATION_SPRAY | CUTANEOUS | Status: DC | PRN
  Filled 2025-01-03 (×2): qty 56

## 2025-01-03 MED ORDER — IBUPROFEN 600 MG PO TABS: 600.0000 mg | ORAL_TABLET | Freq: Four times a day (QID) | ORAL | Status: DC | PRN

## 2025-01-03 MED ORDER — WITCH HAZEL-GLYCERIN EX PADS
MEDICATED_PAD | CUTANEOUS | Status: DC | PRN
  Filled 2025-01-03: qty 100

## 2025-01-03 MED ORDER — QUETIAPINE FUMARATE 300 MG PO TABS: 300.0000 mg | ORAL_TABLET | Freq: Every day | ORAL | Status: DC

## 2025-01-03 MED ORDER — DIBUCAINE (PERIANAL) 1 % EX OINT: 1.0000 | TOPICAL_OINTMENT | CUTANEOUS | Status: AC | PRN

## 2025-01-03 MED ORDER — OLANZAPINE 10 MG IM SOLR: 10.0000 mg | Freq: Three times a day (TID) | INTRAMUSCULAR | Status: DC | PRN

## 2025-01-03 MED ORDER — SIMETHICONE 80 MG PO CHEW: 80.0000 mg | CHEWABLE_TABLET | ORAL | Status: DC | PRN

## 2025-01-03 MED ORDER — HALOPERIDOL LACTATE 5 MG/ML IJ SOLN: 10.0000 mg | Freq: Three times a day (TID) | INTRAMUSCULAR | Status: DC | PRN

## 2025-01-03 MED ORDER — ACETAMINOPHEN 500 MG PO TABS
1000.0000 mg | ORAL_TABLET | Freq: Four times a day (QID) | ORAL | 0 refills | Status: DC
  Filled 2025-01-03: qty 30, 4d supply, fill #0

## 2025-01-03 MED ORDER — HALOPERIDOL 5 MG PO TABS: 5.0000 mg | ORAL_TABLET | Freq: Three times a day (TID) | ORAL | Status: DC | PRN

## 2025-01-03 MED ORDER — DOCUSATE SODIUM 100 MG PO CAPS
100.0000 mg | ORAL_CAPSULE | Freq: Two times a day (BID) | ORAL | 0 refills | Status: DC
  Filled 2025-01-03: qty 10, 5d supply, fill #0

## 2025-01-03 MED ORDER — DOCUSATE SODIUM 100 MG PO CAPS: 100.0000 mg | ORAL_CAPSULE | Freq: Two times a day (BID) | ORAL | Status: DC

## 2025-01-03 MED ORDER — DIPHENHYDRAMINE HCL 50 MG/ML IJ SOLN: 50.0000 mg | INTRAMUSCULAR | Status: DC | PRN

## 2025-01-03 MED ORDER — MAGNESIUM HYDROXIDE 400 MG/5ML PO SUSP: 30.0000 mL | ORAL | Status: DC | PRN

## 2025-01-03 MED ORDER — WITCH HAZEL-GLYCERIN EX PADS: 1.0000 | MEDICATED_PAD | CUTANEOUS | Status: DC | PRN

## 2025-01-03 MED ORDER — FERROUS SULFATE 325 (65 FE) MG PO TABS
325.0000 mg | ORAL_TABLET | Freq: Every day | ORAL | Status: DC
  Administered 2025-01-04 – 2025-01-06 (×3): 325 mg via ORAL
  Filled 2025-01-03 (×3): qty 1

## 2025-01-03 MED ORDER — WITCH HAZEL-GLYCERIN EX PADS
1.0000 | MEDICATED_PAD | CUTANEOUS | 12 refills | Status: DC | PRN
  Filled 2025-01-03: qty 40, 20d supply, fill #0

## 2025-01-03 MED ORDER — QUETIAPINE FUMARATE 200 MG PO TABS
200.0000 mg | ORAL_TABLET | Freq: Every morning | ORAL | Status: DC
  Administered 2025-01-04 – 2025-01-06 (×3): 200 mg via ORAL
  Filled 2025-01-03 (×3): qty 1

## 2025-01-03 MED ORDER — HALOPERIDOL 5 MG PO TABS: 5.0000 mg | ORAL_TABLET | ORAL | Status: DC | PRN

## 2025-01-03 MED ORDER — HALOPERIDOL LACTATE 5 MG/ML IJ SOLN: 10.0000 mg | INTRAMUSCULAR | Status: DC | PRN

## 2025-01-03 MED ORDER — BENZOCAINE-MENTHOL 20-0.5 % EX AERO: 1.0000 | INHALATION_SPRAY | CUTANEOUS | Status: DC | PRN

## 2025-01-03 MED ORDER — HYDROXYZINE HCL 50 MG PO TABS: 50.0000 mg | ORAL_TABLET | Freq: Four times a day (QID) | ORAL | Status: DC | PRN

## 2025-01-03 MED ORDER — MAGNESIUM HYDROXIDE 400 MG/5ML PO SUSP: 30.0000 mL | Freq: Every day | ORAL | Status: DC | PRN

## 2025-01-03 MED ORDER — CALCIUM CARBONATE ANTACID 500 MG PO CHEW: 2.0000 | CHEWABLE_TABLET | ORAL | Status: DC | PRN

## 2025-01-03 MED ORDER — DIPHENHYDRAMINE HCL 25 MG PO CAPS
50.0000 mg | ORAL_CAPSULE | Freq: Four times a day (QID) | ORAL | Status: DC | PRN
  Administered 2025-01-03: 50 mg via ORAL

## 2025-01-03 MED ORDER — PRENATAL PLUS 27-1 MG PO TABS
1.0000 | ORAL_TABLET | Freq: Every day | ORAL | 0 refills | Status: AC
  Filled 2025-01-03: qty 30, 30d supply, fill #0

## 2025-01-03 NOTE — Progress Notes (Signed)
" ° °  Subjective:   Eileen Moody had a NSVB on 01/02/2025. Her labor was uncomplicated. Has had routine postpartum care.  She remains delusional, stating that she has already signed her discharge papers and her only problem is a spider bite. Pain well controlled with oral medications & dermaplast spray. Voiding without problem, has not yet had a BM. \  Objective:  Vital signs in last 24 hours: Temp:  [98 F (36.7 C)-98.4 F (36.9 C)] 98 F (36.7 C) (01/17 0730) Pulse Rate:  [90-158] 90 (01/17 0730) Resp:  [15-20] 15 (01/17 0730) BP: (93-142)/(54-95) 112/75 (01/17 0730) SpO2:  [97 %-100 %] 99 % (01/16 2220) Weight:  [74.8 kg] 74.8 kg (01/16 1251)    General: NAD, disoriented to situation Pulmonary: no increased work of breathing Abdomen: soft, non-tender Fundus: firm, midline, at umbilicus Lochia: light rubra, no clots Perineum: no erythema or foul odor discharge, minimal edema,   Results for orders placed or performed during the hospital encounter of 01/02/25 (from the past 72 hours)  HIV Antibody (routine testing w rflx)     Status: None   Collection Time: 01/02/25  1:18 PM  Result Value Ref Range   HIV Screen 4th Generation wRfx Non Reactive Non Reactive    Comment: Performed at [facility], [address], [place], [place] [address]  CBC     Status: Abnormal   Collection Time: 01/02/25  1:18 PM  Result Value Ref Range   WBC 15.0 (H) 4.0 - 10.5 K/uL   RBC 3.64 (L) 3.87 - 5.11 MIL/uL   Hemoglobin 11.5 (L) 12.0 - 15.0 g/dL   HCT 66.4 (L) 63.9 - 53.9 %   MCV 92.0 80.0 - 100.0 fL   MCH 31.6 26.0 - 34.0 pg   MCHC 34.3 30.0 - 36.0 g/dL   RDW 86.6 88.4 - 84.4 %   Platelets 195 150 - 400 K/uL   nRBC 0.1 0.0 - 0.2 %    Comment: Performed at [facility], [address] [place], [place] [address]  Type and screen     Status: None   Collection Time: 01/02/25  1:18 PM  Result Value Ref Range   ABO/RH(D) A POS    Antibody Screen NEG    Sample Expiration       01/05/2025,2359 Performed at [facility], [address], [place], [place] [address]      Assessment:   29 y.o. [id number] postpartum day 1 s/p spontaneous vaginal delivery on 01/02/2025 Feeding: Bottle, infant in SCN Vital signs Reviewed and stable Pain well controlled  Plan:    Schizophrenia, active psychosis to return to inpatient BHU. BH AC, attending physician notified that patient is stable from an obstetric standpoint and has been discharged. Per [facility] she is awaiting providers to complete readmission/transfer.  Blood Type --/--/A POS (01/16 1318) / Rubella <0.90 (01/13 1437) / Varicella Immune Rhogam: not indicated Tdap: not given Varicella:not indicated Rubella: ordered Flu: not given Continued routine postpartum care in inpatient psychiatric unit, AOB providers will continue to round postpartum.     Harlene LITTIE Cisco, CNM [facility] 01/03/2025, 7:57 AM    "

## 2025-01-03 NOTE — BHH Counselor (Signed)
 Adult Comprehensive Assessment  Patient ID: Chrystel Barefield, female   DOB: 09-09-1996, 29 y.o.   MRN: [id number]  Information Source: Information source: Patient Chart Review  Current Stressors:  Patient states their primary concerns and needs for treatment are:: Getting correct medications and dossages. Patient states their goals for this hospitilization and ongoing recovery are:: To stay calm, cool, and collected and to make it home Educational / Learning stressors: Unable to assess Employment / Job issues: Unable to assess Family Relationships: Unable to Counsellor / Lack of resources (include bankruptcy): Unable to assess Housing / Lack of housing: Unable to assess Physical health (include injuries & life threatening diseases): Unable to assess Social relationships: Unable to assess Substance abuse: Unable to assess Bereavement / Loss: Unable to assess  Living/Environment/Situation:  Living Arrangements: Children, Other relatives Living conditions (as described by patient or guardian): Unable to assess Who else lives in the home?: The patient stated her children and her sister would come and stay with her. How long has patient lived in current situation?: Unable to assess  Family History:  What is your sexual orientation?: Unable to assess. Has your sexual activity been affected by drugs, alcohol, medication, or emotional stress?: Unable to assess. Does patient have children?: Yes How many children?: 4 How is patient's relationship with their children?: Patient reports that she is currently pregnant with her 4th child. Unable to assess relationship with her children.  Childhood History:  Additional childhood history information: Unable to assess. Description of patient's relationship with caregiver when they were a child: Unable to assess. Patient's description of current relationship with people who raised him/her: Unable to assess How were you disciplined when you  got in trouble as a child/adolescent?: Unable to assess.  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 Where was the Patient Employed at that Time?: Unable to assess.  Financial Resources:   Financial resources: Medicaid  Alcohol/Substance Abuse:   What has been your use of drugs/alcohol within the last 12 months?: Unable to assess  Social Support System:   Patient's Community Support System: [employer]hib[employer] Officer System: Sister is coming to live with me to help me while im at work Type of faith/religion: Unable to assess How does patient's faith help to cope with current illness?: Unable to assess     Strengths/Needs:   What is the patient's perception of their strengths?: Unable to assess Patient states they can use these personal strengths during their treatment to contribute to their recovery: Unable to assess Patient states these barriers may affect/interfere with their treatment: None reported Patient states these barriers may affect their return to the community: None reported Other important information patient would like considered in planning for their treatment: None reported  Discharge Plan:   Currently receiving community mental health services:  (Unable to assess) Patient states concerns and preferences for aftercare planning are: None reported Patient states they will know when they are safe and ready for discharge when: cause people feel safe around me Does patient have access to transportation?: Yes Does patient have financial barriers related to discharge medications?: No Patient description of barriers related to discharge medications: None reported Will patient be returning to same  living situation after discharge?: Yes  Summary/Recommendations:   The patient  is a 29 year old female from [place]  [facility]) who hospital initially voluntarily presented to the hospital with concerns of being bit by a wolf spider. The hospital staff became concerned with the patients presentation of disorganized thinking, paranoia and history of Schizophrenia and the patient was IVCed. The disorganization of the patient resulted to some of the information being obtained through chart review. The patient returned to the unit after giving birth.  Recommendations include crisis stabilization, therapeutic milieu, encourage group attendance and participation, medication management for detox/mood stabilization and development of comprehensive mental wellness/sobriety plan.   Roselyn GORMAN Lento. 01/03/2025

## 2025-01-03 NOTE — Tx Team (Signed)
 Initial Treatment Plan 01/03/2025 5:09 PM [name] FMW:[id number]    PATIENT STRESSORS: Other: Recent delivery of child     PATIENT STRENGTHS: Supportive family/friends    PATIENT IDENTIFIED PROBLEMS: Depression                     DISCHARGE CRITERIA:  Ability to meet basic life and health needs Improved stabilization in mood, thinking, and/or behavior  PRELIMINARY DISCHARGE PLAN: Attend aftercare/continuing care group  PATIENT/FAMILY INVOLVEMENT: This treatment plan has been presented to and reviewed with the patient, Eileen Moody.  The patient has been given the opportunity to ask questions and make suggestions.  [name], RN 01/03/2025, 5:09 PM

## 2025-01-03 NOTE — Progress Notes (Signed)
 Patient arrived to [facility] from Labor and delivery. RN and MHT both had to go to L&D to help verbally explain to the patient why she needed to be transferred back to BMU. Patient expressed that she was upset that she had to be separated from her baby so soon. After several minutes, patient then became agreeable to being transferred to the unit. Patient was reoriented to her room. She declined to complete admission paperwork

## 2025-01-03 NOTE — Plan of Care (Signed)
   Problem: Education: Goal: Emotional status will improve Outcome: Progressing Goal: Mental status will improve Outcome: Progressing   Problem: Safety: Goal: Periods of time without injury will increase Outcome: Progressing

## 2025-01-03 NOTE — TOC Progression Note (Signed)
 Transition of Care [facility]) - Progression Note    Patient Details  Name: Eileen Moody MRN: [id number] Date of Birth: [birth year]  [facility] CM/SW Contact  Sable Knoles L Lanaysia [place], [place] Phone Number: 01/03/2025, 4:55 PM  Clinical Narrative:      CSW received a call from On-call CPS SW, Vernell with [employer]. CSW advised that the baby was delivered and is currently in the SCN. CSW provided related documentation. CSW advised that DSS is already involved and report will be accepted as additional information  Chart reviewed. Correction needed. DSS DOES NOT have custody of this baby. CSW advised of the mother's current state. Mother is not able to make decisions. There was a plan that the mother may have made with the maternal aunt, however due to the mothers current state, she is not able to advise or confirm that plan. Vernell advised that she will search the record but believes that it was confirmed that the baby is supposed to discharge to the aunt in question. Vernell advised that if DSS had to take legal action, the baby would most likely discharge to the aunt's care anyway as she already has the mothers other children. Vernell advised that she would follow-up with CSW when she is able to gather the information.                    Expected Discharge Plan and Services         Expected Discharge Date: 01/03/25                                     Social Drivers of Health (SDOH) Interventions SDOH Screenings   Food Insecurity: No Food Insecurity (01/03/2025)  Housing: Low Risk (01/03/2025)  Transportation Needs: Unmet Transportation Needs (01/03/2025)  Utilities: At Risk (01/03/2025)  Alcohol Screen: Low Risk (01/03/2025)  Financial Resource Strain: Low Risk (01/29/2023)   Received from [facility]  Physical Activity: Insufficiently Active (01/29/2023)   Received from [facility]  Social Connections: Socially Isolated (12/20/2024)  Stress: No Stress Concern Present  (01/29/2023)   Received from [facility]  Tobacco Use: High Risk (01/02/2025)    Readmission Risk Interventions     No data to display

## 2025-01-03 NOTE — Progress Notes (Signed)
 Patient was admitted from Labor and Delivery unit. Patient was resistant to being transferred to behavioral health as she expressed concerns about not being able to see her child so soon after delivery. MHT accompanied RN to L&D and was able to successful encourage her to transfer to BMU. Patient did not wish to complete admission paper work. She remains paranoid and delusional. Education was provided on the post partum supplies available to her such as cold packs, pads, bottle for flushing and derma spray. Patient was reoriented to her room.

## 2025-01-03 NOTE — Group Note (Signed)
 Date:  01/03/2025 Time:  9:48 PM    Additional Comments:  Did not attend group.  Eileen Moody 01/03/2025, 9:48 PM
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
## 2025-01-03 NOTE — Progress Notes (Signed)
 Report called to Jon Lucks, RN in [place]. [name] to be discharged from L&D and readmitted to [facility] per previously entered IVC order. Postpartum discharge teaching and information given to patient.
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
## 2025-01-03 NOTE — Progress Notes (Signed)
" °   01/03/25 1115  Psych Admission Type (Psych Patients Only)  Admission Status Involuntary  Psychosocial Assessment  Patient Complaints Anxiety  Eye Contact Fair  Facial Expression Anxious  Affect Apprehensive  Speech Soft  Interaction Assertive  Motor Activity Slow  Appearance/Hygiene In scrubs  Behavior Characteristics Cooperative;Appropriate to situation  Mood Pleasant  Thought Process  Coherency WDL  Content Preoccupation  Delusions None reported or observed  Perception Derealization  Hallucination None reported or observed  Judgment Limited  Confusion Mild  Danger to Self  Current suicidal ideation? Denies  Agreement Not to Harm Self Yes  Description of Agreement verbal  Danger to Others  Danger to Others None reported or observed    "

## 2025-01-03 NOTE — Progress Notes (Signed)
 Ambika transferred to [facility] accompanied by security and Herbalist.  Derril Franek M Roberta Angell, CNM
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
## 2025-01-03 NOTE — Progress Notes (Signed)
 Patient not agreeable to discharge from our unit/ transfer to [facility]. Notified RN and providers, RN from [place] arrived at L&D and assisted in transfer. Discharged to [facility] room 319 via wheelchair with BHU RN and tech, 1:1 sitter following patient downstairs.

## 2025-01-04 LAB — COMPREHENSIVE METABOLIC PANEL WITH GFR
ALT: 22 U/L (ref 0–44)
AST: 30 U/L (ref 15–41)
Albumin: 2.9 g/dL — ABNORMAL LOW (ref 3.5–5.0)
Alkaline Phosphatase: 127 U/L — ABNORMAL HIGH (ref 38–126)
Anion gap: 7 (ref 5–15)
BUN: 6 mg/dL (ref 6–20)
CO2: 24 mmol/L (ref 22–32)
Calcium: 9.1 mg/dL (ref 8.9–10.3)
Chloride: 108 mmol/L (ref 98–111)
Creatinine, Ser: 0.72 mg/dL (ref 0.44–1.00)
GFR, Estimated: >60 mL/min
Glucose, Bld: 77 mg/dL (ref 70–99)
Potassium: 4.4 mmol/L (ref 3.5–5.1)
Sodium: 139 mmol/L (ref 135–145)
Total Bilirubin: 0.2 mg/dL (ref 0.0–1.2)
Total Protein: 6.1 g/dL — ABNORMAL LOW (ref 6.5–8.1)

## 2025-01-04 LAB — CBC WITH DIFFERENTIAL/PLATELET
Abs Immature Granulocytes: 0.16 K/uL — ABNORMAL HIGH (ref 0.00–0.07)
Basophils Absolute: 0 K/uL (ref 0.0–0.1)
Basophils Relative: 0 %
Eosinophils Absolute: 0.3 K/uL (ref 0.0–0.5)
Eosinophils Relative: 2 %
HCT: 35.1 % — ABNORMAL LOW (ref 36.0–46.0)
Hemoglobin: 11.5 g/dL — ABNORMAL LOW (ref 12.0–15.0)
Immature Granulocytes: 1 %
Lymphocytes Relative: 25 %
Lymphs Abs: 3.5 K/uL (ref 0.7–4.0)
MCH: 30.7 pg (ref 26.0–34.0)
MCHC: 32.8 g/dL (ref 30.0–36.0)
MCV: 93.9 fL (ref 80.0–100.0)
Monocytes Absolute: 1 K/uL (ref 0.1–1.0)
Monocytes Relative: 7 %
Neutro Abs: 8.8 K/uL — ABNORMAL HIGH (ref 1.7–7.7)
Neutrophils Relative %: 65 %
Platelets: 218 K/uL (ref 150–400)
RBC: 3.74 MIL/uL — ABNORMAL LOW (ref 3.87–5.11)
RDW: 13.7 % (ref 11.5–15.5)
WBC: 13.7 K/uL — ABNORMAL HIGH (ref 4.0–10.5)
nRBC: 0 % (ref 0.0–0.2)

## 2025-01-04 NOTE — Group Note (Signed)
 Date:  01/04/2025 Time:  11:08 AM  Group Topic/Focus:  Goals Group:   The focus of this group is to help patients establish daily goals to achieve during treatment and discuss how the patient can incorporate goal setting into their daily lives to aide in recovery.    Participation Level:  Active  Participation Quality:  Appropriate  Affect:  Appropriate  Cognitive:  Alert  Insight: Appropriate  Engagement in Group:  Engaged  Modes of Intervention:  Activity, Discussion, and Education  Additional Comments:    Skippy LITTIE Bennett 01/04/2025, 11:08 AM

## 2025-01-04 NOTE — Group Note (Signed)
 Date:  01/04/2025 Time:  9:09 PM  Group Topic/Focus:  Wrap-Up Group:   The focus of this group is to help patients review their daily goal of treatment and discuss progress on daily workbooks.    Participation Level:  Active  Participation Quality:  Appropriate and Attentive  Affect:  Appropriate  Cognitive:  Appropriate  Insight: Appropriate  Engagement in Group:  Engaged and Supportive  Modes of Intervention:  Discussion  Additional Comments:     Kerri Katz 01/04/2025, 9:09 PM

## 2025-01-04 NOTE — Plan of Care (Signed)
" °  Problem: Education: Goal: Knowledge of [facility] Education information/materials will improve 01/04/2025 0723 by Scarlet Jannett Humbles, RN Outcome: Progressing 01/04/2025 0708 by Scarlet Jannett Humbles, RN Outcome: Progressing   Problem: Education: Goal: Emotional status will improve 01/04/2025 0723 by Scarlet Jannett Humbles, RN Outcome: Progressing 01/04/2025 0708 by Scarlet Jannett Humbles, RN Outcome: Progressing   Problem: Education: Goal: Mental status will improve 01/04/2025 0723 by Scarlet Jannett Humbles, RN Outcome: Progressing 01/04/2025 0708 by Scarlet Jannett Humbles, RN Outcome: Progressing   Problem: Education: Goal: Verbalization of understanding the information provided will improve Outcome: Progressing   "

## 2025-01-04 NOTE — Plan of Care (Signed)
   Problem: Education: Goal: Emotional status will improve Outcome: Progressing Goal: Mental status will improve Outcome: Progressing   Problem: Activity: Goal: Sleeping patterns will improve Outcome: Progressing

## 2025-01-04 NOTE — Progress Notes (Signed)
 Patient ID: Eileen Moody, female   DOB: 12-26-1995, 29 y.o.   MRN: [id number] [facility] MD Progress Note  01/04/2025 12:03 PM Eileen Moody  MRN:  [id number]  Patient is a 29 year old Caucasian female with a history of schizophrenia who was initially admitted to the inpatient psychiatric unit after presenting to the ED for altered mental status and paranoia. She is currently pregnant, with an estimated due date of January 13, 2025, according to chart review. The last documented prenatal care visit is from October 2025. Patient had started treatment with Quetiapine  200 mg at bedtime but remained in a psychotic state. Her labor was induced yesterday after being discharged from the inpatient psychiatric unit, and she was subsequently admitted to the mother-baby unit for delivery.   Subjective:  Chart reviewed, case discussed in multidisciplinary meeting, patient seen during rounds.   01/04/2025: Patient was seen in the hall this morning for psychiatric reassessment during rounds. Patient continues to have a 1:1 for her safety per protocol. She remains grossly delusional stating that she need to be discharged and has stayed here long enough. Patient reports she has an upcoming court date on February 8th and after that she has to discharge her baby from NICU and pay her rent. Patient states she breast fed her son but now he is on a feeding tube. Patient denies any psychiatric symptoms today, including depression, anxiety, delusional thinking, or hallucinations. She states she is tolerating her medications well, taking Quetiapine  200 mg QAM and 300 mg QHS. Patient reports sleeping and eating well. Will continue to monitor.   Past Psychiatric History: see h&P Family History:  Family History  Problem Relation Age of Onset   Diabetes Neg Hx    Hypertension Neg Hx    Cancer Neg Hx    Social History:  Social History   Substance and Sexual Activity  Alcohol Use No     Social History   Substance and Sexual  Activity  Drug Use No    Social History   Socioeconomic History   Marital status: Single    Spouse name: [name]   Number of children: 2   Years of education: Not on file   Highest education level: Not on file  Occupational History   Not on file  Tobacco Use   Smoking status: Every Day    Current packs/day: 1.00    Average packs/day: 1 pack/day for 12.0 years (12.0 ttl pk-yrs)    Types: Cigarettes    Start date: 2014   Smokeless tobacco: Never  Substance and Sexual Activity   Alcohol use: No   Drug use: No   Sexual activity: Yes    Birth control/protection: None  Other Topics Concern   Not on file  Social History Narrative   Not on file   Social Drivers of Health   Tobacco Use: High Risk (01/03/2025)   Patient History    Smoking Tobacco Use: Every Day    Smokeless Tobacco Use: Never    Passive Exposure: Not on file  Financial Resource Strain: Low Risk (01/29/2023)   Received from [facility]   Overall Financial Resource Strain (CARDIA)    Difficulty of Paying Living Expenses: Not hard at all  Food Insecurity: No Food Insecurity (01/03/2025)   Epic    Worried About Radiation Protection Practitioner of Food in the Last Year: Never true    Ran Out of Food in the Last Year: Never true  Transportation Needs: Unmet Transportation Needs (01/03/2025)   Epic    Lack  of Transportation (Medical): Yes    Lack of Transportation (Non-Medical): Yes  Physical Activity: Insufficiently Active (01/29/2023)   Received from [facility]   Exercise Vital Sign    On average, how many days per week do you engage in moderate to strenuous exercise (like a brisk walk)?: 2 days    On average, how many minutes do you engage in exercise at this level?: 30 min  Stress: No Stress Concern Present (01/29/2023)   Received from [facility] - Occupational Stress Questionnaire    Feeling of Stress : Not at all  Social Connections: Socially Isolated (12/20/2024)   Social  Connection and Isolation Panel    Frequency of Communication with Friends and Family: Twice a week    Frequency of Social Gatherings with Friends and Family: Twice a week    Attends Religious Services: Never    Database Administrator or Organizations: No    Attends Banker Meetings: Never    Marital Status: Never married  Depression (PHQ2-9): Not on file  Alcohol Screen: Low Risk (01/03/2025)   Alcohol Screen    Last Alcohol Screening Score (AUDIT): 0  Housing: Low Risk (01/03/2025)   Epic    Unable to Pay for Housing in the Last Year: No    Number of Times Moved in the Last Year: 1    Homeless in the Last Year: No  Utilities: At Risk (01/03/2025)   Epic    Threatened with loss of utilities: Yes  Health Literacy: Not on file   Past Medical History:  Past Medical History:  Diagnosis Date   Anemia    History of anemia    during pregnancy   Schizophrenia (HCC)    STD (sexually transmitted disease) 2015   H/o gonorrhea/chlamydia.  Treated    Past Surgical History:  Procedure Laterality Date   NO PAST SURGERIES      Current Medications: Current Facility-Administered Medications  Medication Dose Route Frequency Provider Last Rate Last Admin   acetaminophen  (TYLENOL ) tablet 1,000 mg  1,000 mg Oral Q6H [name], [name], NP   1,000 mg at 01/04/25 1153   benzocaine -Menthol  (DERMOPLAST) 20-0.5 % topical spray 1 Application  1 Application Topical PRN [name], NP       calcium  carbonate (TUMS - dosed in mg elemental calcium ) chewable tablet 400 mg of elemental calcium   2 tablet Oral PRN [name], [name], CNM       coconut oil  1 Application Topical PRN [name], NP       witch hazel-glycerin  (TUCKS) pad 1 Application  1 Application Topical PRN [name], NP       And   dibucaine (NUPERCAINAL) 1 % rectal ointment 1 Application  1 Application Rectal PRN [name], [name], NP       diphenhydrAMINE  (BENADRYL ) injection 50-100 mg  50-100 mg Intramuscular Q4H PRN  [name], [name], NP       docusate sodium  (COLACE) capsule 100 mg  100 mg Oral BID [name], [name], NP   100 mg at 01/04/25 0813   ferrous sulfate  tablet 325 mg  325 mg Oral Q breakfast [name], [name], NP   325 mg at 01/04/25 0813   haloperidol  (HALDOL ) tablet 5 mg  5 mg Oral Q4H PRN [name], [name], NP       haloperidol  lactate (HALDOL ) injection 10 mg  10 mg Intramuscular Q4H PRN [name], [name], NP  haloperidol  lactate (HALDOL ) injection 5 mg  5 mg Intramuscular Q4H PRN [name], [name], NP       hydrOXYzine  (ATARAX ) tablet 50 mg  50 mg Oral Q6H PRN [name], [name], NP       ibuprofen  (ADVIL ) tablet 600 mg  600 mg Oral Q6H PRN [name], [name], CNM       ibuprofen  (ADVIL ) tablet 600 mg  600 mg Oral Q6H [name], [name], NP   600 mg at 01/04/25 1152   magnesium  hydroxide (MILK OF MAGNESIA) suspension 30 mL  30 mL Oral Q3 days PRN Claudene Sham B, NP       nicotine  (NICODERM CQ  - dosed in mg/24 hours) patch 21 mg  21 mg Transdermal Q0600 [name], [name], MD   21 mg at 01/04/25 0813   OLANZapine  (ZYPREXA ) injection 10 mg  10 mg Intramuscular Q8H PRN Claudene Sham B, NP       ondansetron  (ZOFRAN ) tablet 4 mg  4 mg Oral Q4H PRN Claudene Sham B, NP       Or   ondansetron  (ZOFRAN ) injection 4 mg  4 mg Intravenous Q4H PRN [name], [name], NP       prenatal multivitamin tablet 1 tablet  1 tablet Oral Q1200 [name], [name], MD   1 tablet at 01/04/25 1152   QUEtiapine  (SEROQUEL ) tablet 200 mg  200 mg Oral q AM [name], [name], MD   200 mg at 01/04/25 0813   QUEtiapine  (SEROQUEL ) tablet 300 mg  300 mg Oral QHS [name], [name], MD   300 mg at 01/04/25 0021   simethicone  (MYLICON) chewable tablet 80 mg  80 mg Oral PRN Claudene Sham NOVAK, NP       witch hazel-glycerin  (TUCKS) pad   Topical PRN [name] Eleanor HERO, CNM        Lab Results:  Results for orders placed or performed during the hospital encounter of 01/03/25 (from the past 48 hours)  CBC with Differential/Platelet     Status: Abnormal   Collection Time:  01/04/25  7:08 AM  Result Value Ref Range   WBC 13.7 (H) 4.0 - 10.5 K/uL   RBC 3.74 (L) 3.87 - 5.11 MIL/uL   Hemoglobin 11.5 (L) 12.0 - 15.0 g/dL   HCT 64.8 (L) 63.9 - 53.9 %   MCV 93.9 80.0 - 100.0 fL   MCH 30.7 26.0 - 34.0 pg   MCHC 32.8 30.0 - 36.0 g/dL   RDW 86.2 88.4 - 84.4 %   Platelets 218 150 - 400 K/uL   nRBC 0.0 0.0 - 0.2 %   Neutrophils Relative % 65 %   Neutro Abs 8.8 (H) 1.7 - 7.7 K/uL   Lymphocytes Relative 25 %   Lymphs Abs 3.5 0.7 - 4.0 K/uL   Monocytes Relative 7 %   Monocytes Absolute 1.0 0.1 - 1.0 K/uL   Eosinophils Relative 2 %   Eosinophils Absolute 0.3 0.0 - 0.5 K/uL   Basophils Relative 0 %   Basophils Absolute 0.0 0.0 - 0.1 K/uL   Immature Granulocytes 1 %   Abs Immature Granulocytes 0.16 (H) 0.00 - 0.07 K/uL    Comment: Performed at [facility], [address] [place], [place] [address]  Comprehensive metabolic panel     Status: Abnormal   Collection Time: 01/04/25  7:08 AM  Result Value Ref Range   Sodium 139 135 - 145 mmol/L   Potassium 4.4 3.5 - 5.1 mmol/L   Chloride 108 98 - 111 mmol/L  CO2 24 22 - 32 mmol/L   Glucose, Bld 77 70 - 99 mg/dL    Comment: Glucose reference range applies only to samples taken after fasting for at least 8 hours.   BUN 6 6 - 20 mg/dL   Creatinine, Ser 9.27 0.44 - 1.00 mg/dL   Calcium  9.1 8.9 - 10.3 mg/dL   Total Protein 6.1 (L) 6.5 - 8.1 g/dL   Albumin 2.9 (L) 3.5 - 5.0 g/dL   AST 30 15 - 41 U/L   ALT 22 0 - 44 U/L   Alkaline Phosphatase 127 (H) 38 - 126 U/L   Total Bilirubin 0.2 0.0 - 1.2 mg/dL   GFR, Estimated >39 >39 mL/min    Comment: (NOTE) Calculated using the CKD-EPI Creatinine Equation (2021)    Anion gap 7 5 - 15    Comment: Performed at [facility], [address] [place], [place] [address]    Blood Alcohol level:  Lab Results  Component Value Date   [facility] <15 06/20/2024   ETH <10 05/23/2022    Metabolic Disorder Labs: Lab Results  Component Value Date   HGBA1C 4.7 (L)  12/26/2024   MPG 88.19 12/26/2024   MPG 88.19 05/23/2022   No results found for: PROLACTIN Lab Results  Component Value Date   CHOL 220 (H) 12/26/2024   TRIG 181 (H) 12/26/2024   HDL 80 12/26/2024   CHOLHDL 2.8 12/26/2024   VLDL 36 12/26/2024   LDLCALC 104 (H) 12/26/2024   LDLCALC 83 05/23/2022    Physical Findings: AIMS:  , ,  ,  ,    CIWA:    COWS:      Psychiatric Specialty Exam:  Presentation  General Appearance:  Disheveled  Eye Contact: Fair  Speech: Clear and Coherent; Normal Rate  Speech Volume: Normal    Mood and Affect  Mood: Anxious  Affect: Full Range   Thought Process  Thought Processes: Disorganized  Orientation:Partial  Thought Content:Delusions; Tangential  Hallucinations:Hallucinations: Other (comment) (Patient denies auditory hallucinations but frequently talks to herself and has internal preoccupation.)  Ideas of Reference:Delusions; Percusatory; Paranoia  Suicidal Thoughts:Suicidal Thoughts: No  Homicidal Thoughts:Homicidal Thoughts: No   Sensorium  Memory: Immediate Fair  Judgment: Impaired  Insight: Lacking   Executive Functions  Concentration: Poor  Attention Span: Poor  Recall: Poor  Fund of Knowledge: Fair  Language: Fair   Psychomotor Activity  Psychomotor Activity: Psychomotor Activity: Increased; Restlessness  Musculoskeletal: Strength & Muscle Tone: within normal limits Gait & Station: normal Assets  Assets: Physical Health; Social Support    Physical Exam: Physical Exam ROS Blood pressure 129/86, pulse 98, temperature 98.6 F (37 C), temperature source Oral, resp. rate 16, height 5' 5 (1.651 m), last menstrual period 05/01/2024, SpO2 100%, unknown if currently breastfeeding. Body mass index is 27.46 kg/m.  Diagnosis: Principal Problem:   Psychosis, unspecified psychosis type (HCC)   PLAN: Safety and Monitoring:  -- Voluntary admission to inpatient psychiatric unit for  safety, stabilization and treatment  -- Daily contact with patient to assess and evaluate symptoms and progress in treatment  -- Patient's case to be discussed in multi-disciplinary team meeting  -- Observation Level : q15 minute checks  -- Vital signs:  q12 hours  -- Precautions: suicide, elopement, and assault -- Encouraged patient to participate in unit milieu and in scheduled group therapies  2. Psychiatric Treatment:  Scheduled Medications:  -Continue Quetiapine  200 mg QAM and 300 QHS    -- The risks/benefits/side-effects/alternatives to this medication were  discussed in detail with the patient and time was given for questions. The patient consents to medication trial.  3. Medical Issues Being Addressed:   -Postpartum complication, vaginal delivery 1 day ago.   4. Discharge Planning:   -- Social work and case management to assist with discharge planning and identification of hospital follow-up needs prior to discharge  -- Estimated LOS: 5-7 days -- Discharge Concerns: Need to establish a safety plan; Medication compliance and effectiveness             -- Discharge Goals: Return home with outpatient referrals follow ups   Camelia JINNY Mountain, NP 01/04/2025, 12:03 PM

## 2025-01-04 NOTE — Progress Notes (Signed)
 "  Subjective:   Eileen Moody had a NSVB on 01/02/2025. She is currently admitted under the Psychiatry service for treatment of paranoid schizophrenia. She was discharged from the Inpatient OB service yesterday, on Postpartum Day 1, and transferred back to the BMU. Her infant is in the Special Care Unit.    Labor course: uncomplicated. Her labor was induced for gestational hypertension at term. She had a NSVD on 1/16, and has had anticipated postpartum course thus far.   Eating, hydrating, and voiding regularly without difficulty.  Ambulating: Yes Since delivery has had BM.  Reports vaginal bleeding is like a period, denies passing large blood clots.  Uterine cramping relieved with tylenol /ibuprofen .  Infant feeding: bottle feeding.  Objective:  Vital signs in last 24 hours: Temp:  [98.6 F (37 C)] 98.6 F (37 C) (01/17 1112) Pulse Rate:  [94-98] 94 (01/18 0659) Resp:  [16-18] 18 (01/18 0659) BP: (126-129)/(78-86) 126/78 (01/18 0659) SpO2:  [98 %-100 %] 98 % (01/18 0659) Last BM Date : 01/03/25  General: NAD Pulmonary: no increased work of breathing Breasts: soft, non-tender, nipples without breakdown Abdomen: soft, non-tender Fundus: firm, midline, at U-2 Lochia: light rubra, no clots Extremities: trace edema, no erythema, no tenderness  Results for orders placed or performed during the hospital encounter of 01/03/25 (from the past 72 hours)  CBC with Differential/Platelet     Status: Abnormal   Collection Time: 01/04/25  7:08 AM  Result Value Ref Range   WBC 13.7 (H) 4.0 - 10.5 K/uL   RBC 3.74 (L) 3.87 - 5.11 MIL/uL   Hemoglobin 11.5 (L) 12.0 - 15.0 g/dL   HCT 64.8 (L) 63.9 - 53.9 %   MCV 93.9 80.0 - 100.0 fL   MCH 30.7 26.0 - 34.0 pg   MCHC 32.8 30.0 - 36.0 g/dL   RDW 86.2 88.4 - 84.4 %   Platelets 218 150 - 400 K/uL   nRBC 0.0 0.0 - 0.2 %   Neutrophils Relative % 65 %   Neutro Abs 8.8 (H) 1.7 - 7.7 K/uL   Lymphocytes Relative 25 %   Lymphs Abs 3.5 0.7 - 4.0 K/uL    Monocytes Relative 7 %   Monocytes Absolute 1.0 0.1 - 1.0 K/uL   Eosinophils Relative 2 %   Eosinophils Absolute 0.3 0.0 - 0.5 K/uL   Basophils Relative 0 %   Basophils Absolute 0.0 0.0 - 0.1 K/uL   Immature Granulocytes 1 %   Abs Immature Granulocytes 0.16 (H) 0.00 - 0.07 K/uL    Comment: Performed at [facility], [address] [place], [place] [address]  Comprehensive metabolic panel     Status: Abnormal   Collection Time: 01/04/25  7:08 AM  Result Value Ref Range   Sodium 139 135 - 145 mmol/L   Potassium 4.4 3.5 - 5.1 mmol/L   Chloride 108 98 - 111 mmol/L   CO2 24 22 - 32 mmol/L   Glucose, Bld 77 70 - 99 mg/dL    Comment: Glucose reference range applies only to samples taken after fasting for at least 8 hours.   BUN 6 6 - 20 mg/dL   Creatinine, Ser 9.27 0.44 - 1.00 mg/dL   Calcium  9.1 8.9 - 10.3 mg/dL   Total Protein 6.1 (L) 6.5 - 8.1 g/dL   Albumin 2.9 (L) 3.5 - 5.0 g/dL   AST 30 15 - 41 U/L   ALT 22 0 - 44 U/L   Alkaline Phosphatase 127 (H) 38 - 126 U/L  Total Bilirubin 0.2 0.0 - 1.2 mg/dL   GFR, Estimated >39 >39 mL/min    Comment: (NOTE) Calculated using the CKD-EPI Creatinine Equation (2021)    Anion gap 7 5 - 15    Comment: Performed at [facility], [address] [place], [place] [address]     Assessment:   29 y.o. [id number] postpartum day 2 s/p spontaneous vaginal delivery on 01/02/2025 Bottle Blood pressures: Normotensive without antihypertensives. Anemia: None Pain: well controlled Plan:  PO PNV Feeding plan: formula feeding. Discussed breast care. Encouraged wearing a supportive bra, use of ibuprofen  for treatment of engorgement. Anticipate peak engorgement at 3-5 days PP, then improvement.  Counseled on normal uterine involution and vaginal bleeding postpartum. Typical postpartum bleeding lasts 4-6 wks postpartum.  OB to sign off for now. Please let us  know if the patient has sustained SBP >140 or DBP >90, if there are concerns  about her postpartum recovery, and prior to discharge for discussion re: contraceptive care planning.   Lauraine Lakes, CNM [facility] 01/04/2025, 10:53 AM "

## 2025-01-04 NOTE — Progress Notes (Signed)
" °   01/04/25 0805  Psych Admission Type (Psych Patients Only)  Admission Status Involuntary  Psychosocial Assessment  Patient Complaints Anxiety  Eye Contact Fair  Facial Expression Anxious  Affect Apprehensive  Speech Soft  Interaction Assertive  Motor Activity Slow  Appearance/Hygiene In scrubs  Behavior Characteristics Cooperative;Appropriate to situation  Mood Pleasant  Thought Process  Coherency WDL  Content Preoccupation  Delusions None reported or observed  Perception Derealization  Hallucination None reported or observed  Judgment Limited  Confusion Mild  Danger to Self  Current suicidal ideation? Denies  Agreement Not to Harm Self Yes  Description of Agreement verbal  Danger to Others  Danger to Others None reported or observed  Danger to Others Abnormal  Destructive Behavior No threats or harm toward property    "

## 2025-01-04 NOTE — Group Note (Signed)
 [facility] LCSW Group Therapy Note   Group Date: 01/04/2025 Start Time: 1400 End Time: 1500   Type of Therapy/Topic:  Group Therapy:  Emotion Regulation  Participation Level:  Did Not Attend   Mood:  Description of Group:    The purpose of this group is to assist patients in learning to regulate negative emotions and experience positive emotions. Patients will be guided to discuss ways in which they have been vulnerable to their negative emotions. These vulnerabilities will be juxtaposed with experiences of positive emotions or situations, and patients challenged to use positive emotions to combat negative ones. Special emphasis will be placed on coping with negative emotions in conflict situations, and patients will process healthy conflict resolution skills.  Therapeutic Goals: Patient will identify two positive emotions or experiences to reflect on in order to balance out negative emotions:  Patient will label two or more emotions that they find the most difficult to experience:  Patient will be able to demonstrate positive conflict resolution skills through discussion or role plays:   Summary of Patient Progress:  Not able to assess.  Pt did not participate.     Therapeutic Modalities:   Cognitive Behavioral Therapy Feelings Identification Dialectical Behavioral Therapy   Rexene LELON Mae, LCSWA

## 2025-01-05 DIAGNOSIS — F29 Unspecified psychosis not due to a substance or known physiological condition: Secondary | ICD-10-CM

## 2025-01-05 LAB — COMPREHENSIVE METABOLIC PANEL WITH GFR
ALT: 39 U/L (ref 0–44)
AST: 43 U/L — ABNORMAL HIGH (ref 15–41)
Albumin: 3.1 g/dL — ABNORMAL LOW (ref 3.5–5.0)
Alkaline Phosphatase: 97 U/L (ref 38–126)
Anion gap: 11 (ref 5–15)
BUN: 9 mg/dL (ref 6–20)
CO2: 23 mmol/L (ref 22–32)
Calcium: 8.7 mg/dL — ABNORMAL LOW (ref 8.9–10.3)
Chloride: 103 mmol/L (ref 98–111)
Creatinine, Ser: 0.7 mg/dL (ref 0.44–1.00)
GFR, Estimated: >60 mL/min
Glucose, Bld: 97 mg/dL (ref 70–99)
Potassium: 4 mmol/L (ref 3.5–5.1)
Sodium: 138 mmol/L (ref 135–145)
Total Bilirubin: <0.2 mg/dL (ref 0.0–1.2)
Total Protein: 6.2 g/dL — ABNORMAL LOW (ref 6.5–8.1)

## 2025-01-05 LAB — CBC WITH DIFFERENTIAL/PLATELET
Abs Immature Granulocytes: 0.2 K/uL — ABNORMAL HIGH (ref 0.00–0.07)
Basophils Absolute: 0 K/uL (ref 0.0–0.1)
Basophils Relative: 0 %
Eosinophils Absolute: 0.2 K/uL (ref 0.0–0.5)
Eosinophils Relative: 2 %
HCT: 33.1 % — ABNORMAL LOW (ref 36.0–46.0)
Hemoglobin: 11.6 g/dL — ABNORMAL LOW (ref 12.0–15.0)
Immature Granulocytes: 1 %
Lymphocytes Relative: 15 %
Lymphs Abs: 2.2 K/uL (ref 0.7–4.0)
MCH: 31.8 pg (ref 26.0–34.0)
MCHC: 35 g/dL (ref 30.0–36.0)
MCV: 90.7 fL (ref 80.0–100.0)
Monocytes Absolute: 1.1 K/uL — ABNORMAL HIGH (ref 0.1–1.0)
Monocytes Relative: 7 %
Neutro Abs: 11 K/uL — ABNORMAL HIGH (ref 1.7–7.7)
Neutrophils Relative %: 75 %
Platelets: 252 K/uL (ref 150–400)
RBC: 3.65 MIL/uL — ABNORMAL LOW (ref 3.87–5.11)
RDW: 13.6 % (ref 11.5–15.5)
WBC: 14.8 K/uL — ABNORMAL HIGH (ref 4.0–10.5)
nRBC: 0 % (ref 0.0–0.2)

## 2025-01-05 MED ORDER — ARIPIPRAZOLE 10 MG PO TABS
30.0000 mg | ORAL_TABLET | Freq: Every day | ORAL | Status: DC
  Administered 2025-01-05 – 2025-01-06 (×2): 30 mg via ORAL
  Filled 2025-01-05 (×2): qty 3

## 2025-01-05 NOTE — BH IP Treatment Plan (Signed)
 Interdisciplinary Treatment and Diagnostic Plan Update  01/05/2025 Time of Session: 10:11 Eileen Moody MRN: [id number]  Principal Diagnosis: Psychosis, unspecified psychosis type [facility])  Secondary Diagnoses: Principal Problem:   Psychosis, unspecified psychosis type (HCC)   Current Medications:  Current Facility-Administered Medications  Medication Dose Route Frequency Provider Last Rate Last Admin   acetaminophen  (TYLENOL ) tablet 1,000 mg  1,000 mg Oral Q6H [name], [name], NP   1,000 mg at 01/05/25 1225   ARIPiprazole  (ABILIFY ) tablet 30 mg  30 mg Oral Daily [name], [name], MD       benzocaine -Menthol  (DERMOPLAST) 20-0.5 % topical spray 1 Application  1 Application Topical PRN [name], NP       calcium  carbonate (TUMS - dosed in mg elemental calcium ) chewable tablet 400 mg of elemental calcium   2 tablet Oral PRN [name], [name], CNM       coconut oil  1 Application Topical PRN [name], NP       witch hazel-glycerin  (TUCKS) pad 1 Application  1 Application Topical PRN [name], NP       And   dibucaine (NUPERCAINAL) 1 % rectal ointment 1 Application  1 Application Rectal PRN [name], NP       diphenhydrAMINE  (BENADRYL ) injection 50-100 mg  50-100 mg Intramuscular Q4H PRN [name], [name], NP       docusate sodium  (COLACE) capsule 100 mg  100 mg Oral BID [name], [name], NP   100 mg at 01/05/25 9087   ferrous sulfate  tablet 325 mg  325 mg Oral Q breakfast [name], [name], NP   325 mg at 01/05/25 0912   haloperidol  (HALDOL ) tablet 5 mg  5 mg Oral Q4H PRN [name], [name], NP       haloperidol  lactate (HALDOL ) injection 10 mg  10 mg Intramuscular Q4H PRN [name], [name], NP       haloperidol  lactate (HALDOL ) injection 5 mg  5 mg Intramuscular Q4H PRN [name], [name], NP       hydrOXYzine  (ATARAX ) tablet 50 mg  50 mg Oral Q6H PRN [name], [name], NP       ibuprofen  (ADVIL ) tablet 600 mg  600 mg Oral Q6H PRN [name], [name], CNM       ibuprofen  (ADVIL ) tablet 600 mg   600 mg Oral Q6H [name], [name], NP   600 mg at 01/05/25 1224   magnesium  hydroxide (MILK OF MAGNESIA) suspension 30 mL  30 mL Oral Q3 days PRN [name], [name], NP       nicotine  (NICODERM CQ  - dosed in mg/24 hours) patch 21 mg  21 mg Transdermal Q0600 [name], [name], MD   21 mg at 01/05/25 0913   OLANZapine  (ZYPREXA ) injection 10 mg  10 mg Intramuscular Q8H PRN [name], [name], NP       ondansetron  (ZOFRAN ) tablet 4 mg  4 mg Oral Q4H PRN [name], [name], NP       Or   ondansetron  (ZOFRAN ) injection 4 mg  4 mg Intravenous Q4H PRN [name], [name], NP       prenatal multivitamin tablet 1 tablet  1 tablet Oral Q1200 [name], [name], MD   1 tablet at 01/05/25 1224   QUEtiapine  (SEROQUEL ) tablet 200 mg  200 mg Oral q AM [name], [name], MD   200 mg at 01/05/25 0912   QUEtiapine  (SEROQUEL ) tablet 300 mg  300 mg Oral QHS [name], [name], MD   300 mg at 01/04/25 2140  simethicone  (MYLICON) chewable tablet 80 mg  80 mg Oral PRN [name], NP       witch hazel-glycerin  (TUCKS) pad   Topical PRN [name], [name], CNM       PTA Medications: Medications Prior to Admission  Medication Sig Dispense Refill Last Dose/Taking   acetaminophen  (TYLENOL ) 500 MG tablet Take 2 tablets (1,000 mg total) by mouth every 6 (six) hours. 30 tablet 0    benzocaine -Menthol  (DERMOPLAST) 20-0.5 % AERO Apply 1 Application topically as needed for irritation (perineal discomfort).      dibucaine (NUPERCAINAL) 1 % OINT Place 1 Application rectally as needed for hemorrhoids.      docusate sodium  (COLACE) 100 MG capsule Take 1 capsule (100 mg total) by mouth 2 (two) times daily. 10 capsule 0    ibuprofen  (ADVIL ) 600 MG tablet Take 1 tablet (600 mg total) by mouth every 6 (six) hours. 30 tablet 0    prenatal vitamin w/FE, FA (PRENATAL 1 + 1) 27-1 MG TABS tablet Take 1 tablet by mouth daily at 12 noon. 30 tablet 0    QUEtiapine  (SEROQUEL ) 300 MG tablet Take 1 tablet (300 mg total) by mouth at bedtime.      witch hazel-glycerin   (TUCKS) pad Apply 1 Application topically as needed for hemorrhoids. 40 each 12     Patient Stressors: Other: Recent delivery of child    Patient Strengths: Supportive family/friends   Treatment Modalities: Medication Management, Group therapy, Case management,  1 to 1 session with clinician, Psychoeducation, Recreational therapy.   Physician Treatment Plan for Primary Diagnosis: Psychosis, unspecified psychosis type (HCC) Long Term Goal(s): Improvement in symptoms so as ready for discharge   Short Term Goals: Ability to maintain clinical measurements within normal limits will improve Compliance with prescribed medications will improve  Medication Management: Evaluate patient's response, side effects, and tolerance of medication regimen.  Therapeutic Interventions: 1 to 1 sessions, Unit Group sessions and Medication administration.  Evaluation of Outcomes: Progressing  Physician Treatment Plan for Secondary Diagnosis: Principal Problem:   Psychosis, unspecified psychosis type (HCC)  Long Term Goal(s): Improvement in symptoms so as ready for discharge   Short Term Goals: Ability to maintain clinical measurements within normal limits will improve Compliance with prescribed medications will improve     Medication Management: Evaluate patient's response, side effects, and tolerance of medication regimen.  Therapeutic Interventions: 1 to 1 sessions, Unit Group sessions and Medication administration.  Evaluation of Outcomes: Progressing   RN Treatment Plan for Primary Diagnosis: Psychosis, unspecified psychosis type (HCC) Long Term Goal(s): Knowledge of disease and therapeutic regimen to maintain health will improve  Short Term Goals: Ability to remain free from injury will improve, Ability to verbalize frustration and anger appropriately will improve, Ability to demonstrate self-control, Ability to participate in decision making will improve, Ability to verbalize feelings will  improve, Ability to disclose and discuss suicidal ideas, Ability to identify and develop effective coping behaviors will improve, and Compliance with prescribed medications will improve  Medication Management: RN will administer medications as ordered by provider, will assess and evaluate patient's response and provide education to patient for prescribed medication. RN will report any adverse and/or side effects to prescribing provider.  Therapeutic Interventions: 1 on 1 counseling sessions, Psychoeducation, Medication administration, Evaluate responses to treatment, Monitor vital signs and CBGs as ordered, Perform/monitor CIWA, COWS, AIMS and Fall Risk screenings as ordered, Perform wound care treatments as ordered.  Evaluation of Outcomes: Progressing   LCSW Treatment Plan for Primary Diagnosis:  Psychosis, unspecified psychosis type (HCC) Long Term Goal(s): Safe transition to appropriate next level of care at discharge, Engage patient in therapeutic group addressing interpersonal concerns.  Short Term Goals: Engage patient in aftercare planning with referrals and resources, Increase social support, Increase ability to appropriately verbalize feelings, Increase emotional regulation, Facilitate acceptance of mental health diagnosis and concerns, Identify triggers associated with mental health/substance abuse issues, and Increase skills for wellness and recovery  Therapeutic Interventions: Assess for all discharge needs, 1 to 1 time with Social worker, Explore available resources and support systems, Assess for adequacy in community support network, Educate family and significant other(s) on suicide prevention, Complete Psychosocial Assessment, Interpersonal group therapy.  Evaluation of Outcomes: Progressing   Progress in Treatment: Attending groups: Yes. Participating in groups: Yes. Taking medication as prescribed: Yes. Toleration medication: Yes. Family/Significant other contact made: Yes,  individual(s) contacted:  mother, Bobbye Petti. Patient understands diagnosis: Yes. Discussing patient identified problems/goals with staff: Yes. Medical problems stabilized or resolved: Yes. Denies suicidal/homicidal ideation: Yes. Issues/concerns per patient self-inventory: No. Other: none.  New problem(s) identified: No, Describe:  none identified.  New Short Term/Long Term Goal(s): elimination of symptoms of psychosis, medication management for mood stabilization; elimination of SI thoughts; development of comprehensive mental wellness/sobriety plan.  Patient Goals:  Going home.   Discharge Plan or Barriers: CSW will assist pt with development of an appropriate aftercare/discharge plan.   Reason for Continuation of Hospitalization: Delusions  Medication stabilization Other; describe Paranoia  Estimated Length of Stay: 1-7 days  Last 3 [place] Suicide Severity Risk Score: Flowsheet Row Admission (Current) from 01/03/2025 in [facility] INPATIENT BEHAVIORAL MEDICINE Admission (Discharged) from 12/20/2024 in [facility] INPATIENT BEHAVIORAL MEDICINE ED from 12/19/2024 in [facility] Emergency Department at [facility]  C-SSRS RISK CATEGORY No Risk No Risk No Risk    Last PHQ 2/9 Scores:     No data to display          Scribe for Treatment Team: Nadara JONELLE Fam, LCSW 01/05/2025 4:16 PM

## 2025-01-05 NOTE — Group Note (Signed)
 [facility] LCSW Group Therapy Note   Group Date: 01/05/2025 Start Time: 1245 End Time: 1357   Type of Therapy/Topic:  Group Therapy:  Emotion Regulation  Participation Level:  Active   Mood: Appropriate   Description of Group:    The purpose of this group is to assist patients in learning to regulate negative emotions and experience positive emotions. Patients will be guided to discuss ways in which they have been vulnerable to their negative emotions. These vulnerabilities will be juxtaposed with experiences of positive emotions or situations, and patients challenged to use positive emotions to combat negative ones. Special emphasis will be placed on coping with negative emotions in conflict situations, and patients will process healthy conflict resolution skills.  Therapeutic Goals: Patient will identify two positive emotions or experiences to reflect on in order to balance out negative emotions:  Patient will label two or more emotions that they find the most difficult to experience:  Patient will be able to demonstrate positive conflict resolution skills through discussion or role plays:   Summary of Patient Progress: During group, patient and group explored the ways in which our thoughts can impact our feelings which impacts our behaviors. Group along with facilitator completed a thermometer activity where different areas of life were explored. Participants were asked to notate in which zone these areas exist in on their personal thermometers. The group then discussed coping skills, and safety plans to help better prepare for potential stressors and learn to better emotionally regulate.      Therapeutic Modalities:   Cognitive Behavioral Therapy Feelings Identification Dialectical Behavioral Therapy   Eileen CHRISTELLA Kerns, LCSW

## 2025-01-05 NOTE — Group Note (Signed)
 Recreation Therapy Group Note   Group Topic:Coping Skills  Group Date: 01/05/2025 Start Time: 1530 End Time: 1615 Facilitators: Celestia Jeoffrey FORBES ARTICE, CTRS Location: Craft Room  Group Description: Coping A-Z. LRT and patients engage in a guided discussion on what coping skills are and gave specific examples. LRT passed out a handout labeled Coping A-Z with blank spaces beside each letter. LRT prompted patients to come up with a coping skill for each of the letters. LRT and patients went over the handout and gave ideas for each letter if anyone had any blanks left on their paper. Patients kept this handout with them that listed 26 different coping skills.   Goal Area(s) Addressed: Patients will be able to define coping skills. Patient will identify new coping skills.  Patient will increase communication.   Affect/Mood: Appropriate   Participation Level: Active and Engaged   Participation Quality: Independent   Behavior: Calm and Cooperative   Speech/Thought Process: Coherent   Insight: Improved   Judgement: Improved   Modes of Intervention: Clarification, Education, Worksheet, and Writing   Patient Response to Interventions:  Attentive, Engaged, Interested , and Receptive   Education Outcome:  Acknowledges education   Clinical Observations/Individualized Feedback: Audreanna was active in their participation of session activities and group discussion. Pt interacted well with LRT and peers duration of session.    Plan: Continue to engage patient in RT group sessions 2-3x/week.   Jeoffrey FORBES Celestia, LRT, CTRS 01/05/2025 5:14 PM

## 2025-01-05 NOTE — Anesthesia Postprocedure Evaluation (Signed)
"   Anesthesia Post Note  Patient: Eileen Moody  Procedure(s) Performed: AN AD HOC LABOR EPIDURAL  Patient location during evaluation: Mother Baby Anesthesia Type: Epidural Level of consciousness: awake and alert Pain management: pain level controlled Vital Signs Assessment: post-procedure vital signs reviewed and stable Respiratory status: spontaneous breathing, nonlabored ventilation and respiratory function stable Cardiovascular status: stable Postop Assessment: no headache, no backache and epidural receding Anesthetic complications: no Comments: Chart review only.  No reported complications. ja   No notable events documented.   Last Vitals:  Vitals:   01/03/25 1112 01/05/25 0823  BP: 129/86 (!) 127/94  Pulse: 98 93  Resp: 16   Temp: 37 C   SpO2: 100% 100%    Last Pain:  Vitals:   01/05/25 0624  TempSrc:   PainSc: 10-Worst pain ever                 Lynwood KANDICE Clause      "

## 2025-01-05 NOTE — Group Note (Signed)
 Recreation Therapy Group Note   Group Topic:Healthy Support Systems  Group Date: 01/05/2025 Start Time: 1020 End Time: 1115 Facilitators: Celestia Jeoffrey FORBES ARTICE, CTRS Location: Craft Room  Group Description: Straw Bridge. In groups or individually, patients were given 10 plastic drinking straws and an equal length of masking tape. Using the materials provided, patients were instructed to build a free-standing bridge-like structure to suspend an everyday item (ex: deck of cards) off the floor or table surface. All materials were required to be used in secondary school teacher. LRT facilitated post-activity discussion reviewing the importance of having strong and healthy support systems in our lives. LRT discussed how the people in our lives serve as the tape and the deck of cards we placed on top of our straw structure are the stressors we face in daily life. LRT and pts discussed what happens in our life when things get too heavy for us , and we don't have strong supports outside of the hospital. Pt shared 2 of their healthy supports in their life aloud in the group.   Goal Area(s) Addressed:  Patient will identify 2 healthy supports in their life. Patient will identify skills to successfully complete activity. Patient will identify correlation of this activity to life post-discharge.  Patient will build on frustration tolerance skills. Patient will increase team building and communication skills.    Affect/Mood: Appropriate   Participation Level: Active and Engaged   Participation Quality: Independent   Behavior: Calm and Cooperative   Speech/Thought Process: Coherent   Insight: Good   Judgement: Good   Modes of Intervention: STEM Activity   Patient Response to Interventions:  Attentive, Engaged, Interested , and Receptive   Education Outcome:  Acknowledges education   Clinical Observations/Individualized Feedback: Eileen Moody was active in their participation of session activities and group  discussion. Pt identified listening and prospering friendship as healthy supports.    Plan: Continue to engage patient in RT group sessions 2-3x/week.   Jeoffrey FORBES Celestia, LRT, CTRS 01/05/2025 11:54 AM

## 2025-01-05 NOTE — Plan of Care (Signed)
  Problem: Education: Goal: Verbalization of understanding the information provided will improve Outcome: Progressing   Problem: Education: Goal: Mental status will improve Outcome: Progressing   Problem: Activity: Goal: Interest or engagement in activities will improve Outcome: Progressing   Problem: Activity: Goal: Sleeping patterns will improve Outcome: Progressing

## 2025-01-05 NOTE — Progress Notes (Signed)
 Patient ID: Eileen Moody, female   DOB: [birth year], 29 y.o.   MRN: [id number] [facility] MD Progress Note  01/05/2025 12:29 PM Eileen Moody  MRN:  [id number]  Patient is a 29 year old Caucasian female with a history of schizophrenia who was initially admitted to the inpatient psychiatric unit after presenting to the ED for altered mental status and paranoia. She is currently pregnant, with an estimated due date of January 13, 2025, according to chart review. The last documented prenatal care visit is from October 2025. Patient had started treatment with Quetiapine  200 mg at bedtime but remained in a psychotic state. Her labor was induced yesterday after being discharged from the inpatient psychiatric unit, and she was subsequently admitted to the mother-baby unit for delivery.   Subjective:  Chart reviewed, case discussed in multidisciplinary meeting, patient seen during rounds.   01/05/25:  01/04/2025: Patient was seen in the hall this morning for psychiatric reassessment during rounds. Patient continues to have a 1:1 for her safety per protocol. She remains grossly delusional stating that she need to be discharged and has stayed here long enough. Patient reports she has an upcoming court date on February 8th and after that she has to discharge her baby from NICU and pay her rent. Patient states she breast fed her son but now he is on a feeding tube. Patient denies any psychiatric symptoms today, including depression, anxiety, delusional thinking, or hallucinations. She states she is tolerating her medications well, taking Quetiapine  200 mg QAM and 300 mg QHS. Patient reports sleeping and eating well. Will continue to monitor.   Past Psychiatric History: see h&P Family History:  Family History  Problem Relation Age of Onset   Diabetes Neg Hx    Hypertension Neg Hx    Cancer Neg Hx    Social History:  Social History   Substance and Sexual Activity  Alcohol Use No     Social History   Substance  and Sexual Activity  Drug Use No    Social History   Socioeconomic History   Marital status: Single    Spouse name: [name]   Number of children: 2   Years of education: Not on file   Highest education level: Not on file  Occupational History   Not on file  Tobacco Use   Smoking status: Every Day    Current packs/day: 1.00    Average packs/day: 1 pack/day for 12.0 years (12.0 ttl pk-yrs)    Types: Cigarettes    Start date: 2014   Smokeless tobacco: Never  Substance and Sexual Activity   Alcohol use: No   Drug use: No   Sexual activity: Yes    Birth control/protection: None  Other Topics Concern   Not on file  Social History Narrative   Not on file   Social Drivers of Health   Tobacco Use: High Risk (01/03/2025)   Patient History    Smoking Tobacco Use: Every Day    Smokeless Tobacco Use: Never    Passive Exposure: Not on file  Financial Resource Strain: Low Risk (01/29/2023)   Received from [facility]   Overall Financial Resource Strain (CARDIA)    Difficulty of Paying Living Expenses: Not hard at all  Food Insecurity: No Food Insecurity (01/03/2025)   Epic    Worried About Radiation Protection Practitioner of Food in the Last Year: Never true    Ran Out of Food in the Last Year: Never true  Transportation Needs: Unmet Transportation Needs (01/03/2025)   Epic  Lack of Transportation (Medical): Yes    Lack of Transportation (Non-Medical): Yes  Physical Activity: Insufficiently Active (01/29/2023)   Received from [facility]   Exercise Vital Sign    On average, how many days per week do you engage in moderate to strenuous exercise (like a brisk walk)?: 2 days    On average, how many minutes do you engage in exercise at this level?: 30 min  Stress: No Stress Concern Present (01/29/2023)   Received from [facility] of Occupational Health - Occupational Stress Questionnaire    Feeling of Stress : Not at all  Social Connections: Socially Isolated (12/20/2024)    Social Connection and Isolation Panel    Frequency of Communication with Friends and Family: Twice a week    Frequency of Social Gatherings with Friends and Family: Twice a week    Attends Religious Services: Never    Database Administrator or Organizations: No    Attends Banker Meetings: Never    Marital Status: Never married  Depression (PHQ2-9): Not on file  Alcohol Screen: Low Risk (01/03/2025)   Alcohol Screen    Last Alcohol Screening Score (AUDIT): 0  Housing: Low Risk (01/03/2025)   Epic    Unable to Pay for Housing in the Last Year: No    Number of Times Moved in the Last Year: 1    Homeless in the Last Year: No  Utilities: At Risk (01/03/2025)   Epic    Threatened with loss of utilities: Yes  Health Literacy: Not on file   Past Medical History:  Past Medical History:  Diagnosis Date   Anemia    History of anemia    during pregnancy   Schizophrenia (HCC)    STD (sexually transmitted disease) 2015   H/o gonorrhea/chlamydia.  Treated    Past Surgical History:  Procedure Laterality Date   NO PAST SURGERIES      Current Medications: Current Facility-Administered Medications  Medication Dose Route Frequency Provider Last Rate Last Admin   acetaminophen  (TYLENOL ) tablet 1,000 mg  1,000 mg Oral Q6H [name], [name], NP   1,000 mg at 01/05/25 1225   benzocaine -Menthol  (DERMOPLAST) 20-0.5 % topical spray 1 Application  1 Application Topical PRN [name], [name], NP       calcium  carbonate (TUMS - dosed in mg elemental calcium ) chewable tablet 400 mg of elemental calcium   2 tablet Oral PRN [name], [name], CNM       coconut oil  1 Application Topical PRN [name], NP       witch hazel-glycerin  (TUCKS) pad 1 Application  1 Application Topical PRN [name], NP       And   dibucaine (NUPERCAINAL) 1 % rectal ointment 1 Application  1 Application Rectal PRN [name], [name], NP       diphenhydrAMINE  (BENADRYL ) injection 50-100 mg  50-100 mg Intramuscular  Q4H PRN [name], [name], NP       docusate sodium  (COLACE) capsule 100 mg  100 mg Oral BID [name], [name], NP   100 mg at 01/05/25 9087   ferrous sulfate  tablet 325 mg  325 mg Oral Q breakfast [name], [name], NP   325 mg at 01/05/25 0912   haloperidol  (HALDOL ) tablet 5 mg  5 mg Oral Q4H PRN [name], [name], NP       haloperidol  lactate (HALDOL ) injection 10 mg  10 mg Intramuscular Q4H PRN [name], [name], NP  haloperidol  lactate (HALDOL ) injection 5 mg  5 mg Intramuscular Q4H PRN [name], [name], NP       hydrOXYzine  (ATARAX ) tablet 50 mg  50 mg Oral Q6H PRN Claudene [place] B, NP       ibuprofen  (ADVIL ) tablet 600 mg  600 mg Oral Q6H PRN [name], [name], CNM       ibuprofen  (ADVIL ) tablet 600 mg  600 mg Oral Q6H [name], [name], NP   600 mg at 01/05/25 1224   magnesium  hydroxide (MILK OF MAGNESIA) suspension 30 mL  30 mL Oral Q3 days PRN Claudene [place] B, NP       nicotine  (NICODERM CQ  - dosed in mg/24 hours) patch 21 mg  21 mg Transdermal Q0600 Lennox Dolberry, MD   21 mg at 01/05/25 0913   OLANZapine  (ZYPREXA ) injection 10 mg  10 mg Intramuscular Q8H PRN Claudene [place] B, NP       ondansetron  (ZOFRAN ) tablet 4 mg  4 mg Oral Q4H PRN Claudene [place] B, NP       Or   ondansetron  (ZOFRAN ) injection 4 mg  4 mg Intravenous Q4H PRN [name], [name], NP       prenatal multivitamin tablet 1 tablet  1 tablet Oral Q1200 Gean Larose, MD   1 tablet at 01/05/25 1224   QUEtiapine  (SEROQUEL ) tablet 200 mg  200 mg Oral q AM Donnelly Mellow, MD   200 mg at 01/05/25 9087   QUEtiapine  (SEROQUEL ) tablet 300 mg  300 mg Oral QHS Carlson Belland, MD   300 mg at 01/04/25 2140   simethicone  (MYLICON) chewable tablet 80 mg  80 mg Oral PRN Claudene Sham NOVAK, NP       witch hazel-glycerin  (TUCKS) pad   Topical PRN [name] Eleanor HERO, CNM        Lab Results:  Results for orders placed or performed during the hospital encounter of 01/03/25 (from the past 48 hours)  CBC with Differential/Platelet     Status: Abnormal    Collection Time: 01/04/25  7:08 AM  Result Value Ref Range   WBC 13.7 (H) 4.0 - 10.5 K/uL   RBC 3.74 (L) 3.87 - 5.11 MIL/uL   Hemoglobin 11.5 (L) 12.0 - 15.0 g/dL   HCT 64.8 (L) 63.9 - 53.9 %   MCV 93.9 80.0 - 100.0 fL   MCH 30.7 26.0 - 34.0 pg   MCHC 32.8 30.0 - 36.0 g/dL   RDW 86.2 88.4 - 84.4 %   Platelets 218 150 - 400 K/uL   nRBC 0.0 0.0 - 0.2 %   Neutrophils Relative % 65 %   Neutro Abs 8.8 (H) 1.7 - 7.7 K/uL   Lymphocytes Relative 25 %   Lymphs Abs 3.5 0.7 - 4.0 K/uL   Monocytes Relative 7 %   Monocytes Absolute 1.0 0.1 - 1.0 K/uL   Eosinophils Relative 2 %   Eosinophils Absolute 0.3 0.0 - 0.5 K/uL   Basophils Relative 0 %   Basophils Absolute 0.0 0.0 - 0.1 K/uL   Immature Granulocytes 1 %   Abs Immature Granulocytes 0.16 (H) 0.00 - 0.07 K/uL    Comment: Performed at [facility], [address] [place], [place] [address]  Comprehensive metabolic panel     Status: Abnormal   Collection Time: 01/04/25  7:08 AM  Result Value Ref Range   Sodium 139 135 - 145 mmol/L   Potassium 4.4 3.5 - 5.1 mmol/L   Chloride 108 98 - 111 mmol/L  CO2 24 22 - 32 mmol/L   Glucose, Bld 77 70 - 99 mg/dL    Comment: Glucose reference range applies only to samples taken after fasting for at least 8 hours.   BUN 6 6 - 20 mg/dL   Creatinine, Ser 9.27 0.44 - 1.00 mg/dL   Calcium  9.1 8.9 - 10.3 mg/dL   Total Protein 6.1 (L) 6.5 - 8.1 g/dL   Albumin 2.9 (L) 3.5 - 5.0 g/dL   AST 30 15 - 41 U/L   ALT 22 0 - 44 U/L   Alkaline Phosphatase 127 (H) 38 - 126 U/L   Total Bilirubin 0.2 0.0 - 1.2 mg/dL   GFR, Estimated >39 >39 mL/min    Comment: (NOTE) Calculated using the CKD-EPI Creatinine Equation (2021)    Anion gap 7 5 - 15    Comment: Performed at [facility], [address] [place], [place] [address]  CBC with Differential/Platelet     Status: Abnormal   Collection Time: 01/05/25 11:39 AM  Result Value Ref Range   WBC 14.8 (H) 4.0 - 10.5 K/uL   RBC 3.65 (L) 3.87 -  5.11 MIL/uL   Hemoglobin 11.6 (L) 12.0 - 15.0 g/dL   HCT 66.8 (L) 63.9 - 53.9 %   MCV 90.7 80.0 - 100.0 fL   MCH 31.8 26.0 - 34.0 pg   MCHC 35.0 30.0 - 36.0 g/dL   RDW 86.3 88.4 - 84.4 %   Platelets 252 150 - 400 K/uL   nRBC 0.0 0.0 - 0.2 %   Neutrophils Relative % 75 %   Neutro Abs 11.0 (H) 1.7 - 7.7 K/uL   Lymphocytes Relative 15 %   Lymphs Abs 2.2 0.7 - 4.0 K/uL   Monocytes Relative 7 %   Monocytes Absolute 1.1 (H) 0.1 - 1.0 K/uL   Eosinophils Relative 2 %   Eosinophils Absolute 0.2 0.0 - 0.5 K/uL   Basophils Relative 0 %   Basophils Absolute 0.0 0.0 - 0.1 K/uL   Immature Granulocytes 1 %   Abs Immature Granulocytes 0.20 (H) 0.00 - 0.07 K/uL    Comment: Performed at [facility], [address] [place], [place] [address]    Blood Alcohol level:  Lab Results  Component Value Date   [facility] <15 06/20/2024   ETH <10 05/23/2022    Metabolic Disorder Labs: Lab Results  Component Value Date   HGBA1C 4.7 (L) 12/26/2024   MPG 88.19 12/26/2024   MPG 88.19 05/23/2022   No results found for: PROLACTIN Lab Results  Component Value Date   CHOL 220 (H) 12/26/2024   TRIG 181 (H) 12/26/2024   HDL 80 12/26/2024   CHOLHDL 2.8 12/26/2024   VLDL 36 12/26/2024   LDLCALC 104 (H) 12/26/2024   LDLCALC 83 05/23/2022    Physical Findings: AIMS:  , ,  ,  ,    CIWA:    COWS:      Psychiatric Specialty Exam:  Presentation  General Appearance:  Disheveled  Eye Contact: Fair  Speech: Clear and Coherent; Normal Rate  Speech Volume: Normal    Mood and Affect  Mood: Anxious  Affect: Full Range   Thought Process  Thought Processes: Disorganized  Orientation:Partial  Thought Content:Delusions; Tangential  Hallucinations:No data recorded  Ideas of Reference:Delusions; Percusatory; Paranoia  Suicidal Thoughts:No data recorded  Homicidal Thoughts:No data recorded   Sensorium  Memory: Immediate  Fair  Judgment: Impaired  Insight: Lacking   Executive Functions  Concentration: Poor  Attention Span: Poor  Recall: Poor  Fund of Knowledge: Fair  Language: Fair   Psychomotor Activity  Psychomotor Activity: No data recorded  Musculoskeletal: Strength & Muscle Tone: within normal limits Gait & Station: normal Assets  Assets: Physical Health; Social Support    Physical Exam: Physical Exam ROS Blood pressure (!) 127/94, pulse 93, temperature 98.6 F (37 C), temperature source Oral, resp. rate 16, height 5' 5 (1.651 m), last menstrual period 05/01/2024, SpO2 100%, unknown if currently breastfeeding. Body mass index is 27.46 kg/m.  Diagnosis: Principal Problem:   Psychosis, unspecified psychosis type (HCC)   PLAN: Safety and Monitoring:  -- Voluntary admission to inpatient psychiatric unit for safety, stabilization and treatment  -- Daily contact with patient to assess and evaluate symptoms and progress in treatment  -- Patient's case to be discussed in multi-disciplinary team meeting  -- Observation Level : q15 minute checks  -- Vital signs:  q12 hours  -- Precautions: suicide, elopement, and assault -- Encouraged patient to participate in unit milieu and in scheduled group therapies  2. Psychiatric Treatment:  Scheduled Medications:  -Continue Quetiapine  200 mg QAM and 300 QHS    -- The risks/benefits/side-effects/alternatives to this medication were discussed in detail with the patient and time was given for questions. The patient consents to medication trial.  3. Medical Issues Being Addressed:   -Postpartum complication, vaginal delivery 1 day ago.   4. Discharge Planning:   -- Social work and case management to assist with discharge planning and identification of hospital follow-up needs prior to discharge  -- Estimated LOS: 5-7 days -- Discharge Concerns: Need to establish a safety plan; Medication compliance and effectiveness              -- Discharge Goals: Return home with outpatient referrals follow ups   Allyn Foil, MD 01/05/2025, 12:29 PM

## 2025-01-05 NOTE — Group Note (Signed)
 Date:  01/05/2025 Time:  8:51 PM  Group Topic/Focus:  Managing Feelings:   The focus of this group is to identify what feelings patients have difficulty handling and develop a plan to handle them in a healthier way upon discharge.    Participation Level:  Did Not Attend  Participation Quality:  none  Affect:  none  Cognitive:  none  Insight: None  Engagement in Group:  none  Modes of Intervention:  none  Additional Comments:    Ginny JONETTA Galeazzi 01/05/2025, 8:51 PM

## 2025-01-05 NOTE — Progress Notes (Signed)
" °   01/05/25 1200  Psych Admission Type (Psych Patients Only)  Admission Status Involuntary  Psychosocial Assessment  Patient Complaints Worrying (patient worried about losing her apartment, going home and paying her bills.)  Eye Contact Fair;Watchful  Facial Expression Anxious;Worried  Affect Preoccupied;Appropriate to circumstance  Speech Logical/coherent;Soft  Interaction Assertive  Motor Activity Slow  Appearance/Hygiene Layered clothes;Unremarkable  Behavior Characteristics Cooperative;Appropriate to situation  Mood Pleasant  Aggressive Behavior  Effect No apparent injury  Thought Process  Coherency WDL  Content Preoccupation  Delusions None reported or observed  Perception WDL  Hallucination None reported or observed  Judgment WDL  Confusion None  Danger to Self  Current suicidal ideation? Denies  Agreement Not to Harm Self Yes  Description of Agreement Verbal  Danger to Others  Danger to Others None reported or observed  Danger to Others Abnormal  Harmful Behavior to others No threats or harm toward other people  Destructive Behavior No threats or harm toward property   Patient's goal for today, per her self-inventory is going home, in which she will be nice in order to achieve her goal. "

## 2025-01-05 NOTE — Group Note (Signed)
 Date:  01/05/2025 Time:  10:54 AM  Group Topic/Focus:  Managing Feelings:   The focus of this group is to identify what feelings patients have difficulty handling and develop a plan to handle them in a healthier way upon discharge.    Participation Level:  Active  Participation Quality:  Appropriate  Affect:  Appropriate  Cognitive:  Appropriate  Insight: Appropriate  Engagement in Group:  Engaged  Modes of Intervention:  Activity  Additional Comments:    Charrise Lardner 01/05/2025, 10:54 AM

## 2025-01-05 NOTE — Plan of Care (Signed)

## 2025-01-05 NOTE — Progress Notes (Signed)
" °   01/04/25 2000  Psych Admission Type (Psych Patients Only)  Admission Status Involuntary  Psychosocial Assessment  Patient Complaints Anxiety  Eye Contact Fair;Brief  Facial Expression Anxious  Affect Apprehensive  Speech Soft  Interaction Assertive;Forwards little;Guarded  Motor Activity Slow  Appearance/Hygiene Improved  Behavior Characteristics Cooperative;Appropriate to situation;Anxious  Mood Pleasant  Thought Process  Coherency WDL  Content Preoccupation  Delusions None reported or observed  Perception Derealization  Hallucination None reported or observed  Judgment Limited  Confusion Mild  Danger to Self  Current suicidal ideation? Denies  Agreement Not to Harm Self Yes  Description of Agreement verbal  Danger to Others  Danger to Others None reported or observed  Danger to Others Abnormal  Destructive Behavior No threats or harm toward property   Patient alert and oriented x 4, affect is flat but brightens on approach, thoughts are organized and coherent, she denies SI/HI/AVH. 15 minutes safety checks maintained.  "

## 2025-01-06 DIAGNOSIS — F250 Schizoaffective disorder, bipolar type: Secondary | ICD-10-CM

## 2025-01-06 LAB — CBC WITH DIFFERENTIAL/PLATELET
Abs Immature Granulocytes: 0.15 K/uL — ABNORMAL HIGH (ref 0.00–0.07)
Basophils Absolute: 0.1 K/uL (ref 0.0–0.1)
Basophils Relative: 0 %
Eosinophils Absolute: 0.3 K/uL (ref 0.0–0.5)
Eosinophils Relative: 3 %
HCT: 35.4 % — ABNORMAL LOW (ref 36.0–46.0)
Hemoglobin: 11.7 g/dL — ABNORMAL LOW (ref 12.0–15.0)
Immature Granulocytes: 1 %
Lymphocytes Relative: 20 %
Lymphs Abs: 2.6 K/uL (ref 0.7–4.0)
MCH: 30.8 pg (ref 26.0–34.0)
MCHC: 33.1 g/dL (ref 30.0–36.0)
MCV: 93.2 fL (ref 80.0–100.0)
Monocytes Absolute: 0.9 K/uL (ref 0.1–1.0)
Monocytes Relative: 7 %
Neutro Abs: 8.8 K/uL — ABNORMAL HIGH (ref 1.7–7.7)
Neutrophils Relative %: 69 %
Platelets: 259 K/uL (ref 150–400)
RBC: 3.8 MIL/uL — ABNORMAL LOW (ref 3.87–5.11)
RDW: 13.6 % (ref 11.5–15.5)
WBC: 12.9 K/uL — ABNORMAL HIGH (ref 4.0–10.5)
nRBC: 0 % (ref 0.0–0.2)

## 2025-01-06 LAB — COMPREHENSIVE METABOLIC PANEL WITH GFR
ALT: 89 U/L — ABNORMAL HIGH (ref 0–44)
AST: 77 U/L — ABNORMAL HIGH (ref 15–41)
Albumin: 3 g/dL — ABNORMAL LOW (ref 3.5–5.0)
Alkaline Phosphatase: 95 U/L (ref 38–126)
Anion gap: 9 (ref 5–15)
BUN: 10 mg/dL (ref 6–20)
CO2: 26 mmol/L (ref 22–32)
Calcium: 9.2 mg/dL (ref 8.9–10.3)
Chloride: 103 mmol/L (ref 98–111)
Creatinine, Ser: 0.87 mg/dL (ref 0.44–1.00)
GFR, Estimated: >60 mL/min
Glucose, Bld: 81 mg/dL (ref 70–99)
Potassium: 4.2 mmol/L (ref 3.5–5.1)
Sodium: 137 mmol/L (ref 135–145)
Total Bilirubin: 0.2 mg/dL (ref 0.0–1.2)
Total Protein: 6.3 g/dL — ABNORMAL LOW (ref 6.5–8.1)

## 2025-01-06 MED ORDER — QUETIAPINE FUMARATE 300 MG PO TABS: 300.0000 mg | ORAL_TABLET | Freq: Every day | ORAL | 0 refills | Status: AC

## 2025-01-06 MED ORDER — WITCH HAZEL-GLYCERIN EX PADS: MEDICATED_PAD | CUTANEOUS | 12 refills | Status: AC | PRN

## 2025-01-06 MED ORDER — ARIPIPRAZOLE ER 400 MG IM PRSY: 400.0000 mg | PREFILLED_SYRINGE | INTRAMUSCULAR | 0 refills | Status: AC

## 2025-01-06 MED ORDER — DOCUSATE SODIUM 100 MG PO CAPS: 100.0000 mg | ORAL_CAPSULE | Freq: Two times a day (BID) | ORAL | 0 refills | Status: AC

## 2025-01-06 MED ORDER — IBUPROFEN 600 MG PO TABS: 600.0000 mg | ORAL_TABLET | Freq: Four times a day (QID) | ORAL | 0 refills | Status: AC | PRN

## 2025-01-06 MED ORDER — WITCH HAZEL-GLYCERIN EX PADS: 1.0000 | MEDICATED_PAD | CUTANEOUS | 12 refills | Status: AC | PRN

## 2025-01-06 MED ORDER — FERROUS SULFATE 325 (65 FE) MG PO TABS: 325.0000 mg | ORAL_TABLET | Freq: Every day | ORAL | 0 refills | Status: AC

## 2025-01-06 MED ORDER — QUETIAPINE FUMARATE 200 MG PO TABS: 200.0000 mg | ORAL_TABLET | Freq: Every morning | ORAL | 0 refills | Status: AC

## 2025-01-06 MED ORDER — ACETAMINOPHEN 500 MG PO TABS: 1000.0000 mg | ORAL_TABLET | Freq: Four times a day (QID) | ORAL | 0 refills | Status: AC

## 2025-01-06 MED ORDER — ARIPIPRAZOLE ER 400 MG IM SRER
400.0000 mg | Freq: Once | INTRAMUSCULAR | Status: AC
  Administered 2025-01-06: 400 mg via INTRAMUSCULAR
  Filled 2025-01-06: qty 2

## 2025-01-06 MED ORDER — DIBUCAINE (PERIANAL) 1 % EX OINT: 1.0000 | TOPICAL_OINTMENT | CUTANEOUS | 0 refills | Status: AC | PRN

## 2025-01-06 NOTE — BHH Counselor (Signed)
 CSW conducted meeting with the pt's mother and psychiatrist for the unit.  Meeting was unplanned and not scheduled.   CSW and provider explained to the mother that pt being ready for discharge does not mean that patient will take the baby home.  CSW updated that [facility] DSS and Mother Baby Unit will need to make determinations on plan for baby.  Mother expressed understanding and reported no concerns.   Sherryle Margo, MSW, LCSW 01/06/2025 3:36 PM

## 2025-01-06 NOTE — BHH Suicide Risk Assessment (Signed)
 B[facility] INPATIENT:  Family/Significant Other Suicide Prevention Education  Suicide Prevention Education:  Education Completed; Coralee Portugal, mom, [phone],  (name of family member/significant other) has been identified by the patient as the family member/significant other with whom the patient will be residing, and identified as the person(s) who will aid the patient in the event of a mental health crisis (suicidal ideations/suicide attempt).  With written consent from the patient, the family member/significant other has been provided the following suicide prevention education, prior to the and/or following the discharge of the patient.  The suicide prevention education provided includes the following: Suicide risk factors Suicide prevention and interventions National Suicide Hotline telephone number [facility] assessment telephone number [facility] Emergency Assistance 911 [facility] and/or Residential Mobile Crisis Unit telephone number  Request made of family/significant other to: Remove weapons (e.g., guns, rifles, knives), all items previously/currently identified as safety concern.   Remove drugs/medications (over-the-counter, prescriptions, illicit drugs), all items previously/currently identified as a safety concern.  The family member/significant other verbalizes understanding of the suicide prevention education information provided.  The family member/significant other agrees to remove the items of safety concern listed above.  Sherryle JINNY Margo 01/06/2025, 8:17 AM

## 2025-01-06 NOTE — BHH Counselor (Signed)
 CSW contacted Nia with [facility] DSS to inform of patient's pending discharge.  Nia expressed concern wondering if patient's disposition has improved.   CSW informed that patient has improved since admission.  No additional issues noted.   Sherryle Margo, MSW, LCSW 01/06/2025 12:24 PM

## 2025-01-06 NOTE — BHH Counselor (Signed)
 CSW received return call from Nia with [facility] DSS who expressed concern that patient is being discharged.  CSW explained that patient has improved and identified as being discharge appropriate.   DSS expressed that pts mother had concerns and CSW and asked that pts mother be conferenced into the call.  Mother was called.  Mother expressed concern at the patient not having appointments and CSW explained that pt has aftercare appointments established for Friday 01/09/2025.   Mother expressed concern for the patient's ability to care for her newborn.  CSW explained that she is unsure of any plans as it relates to the patinet's newborn and that mother would need to follow up with Mother & Baby for clarification on plans.  Mother expressed understanding.   CSW provided the contact information for Medical Records when mother requested information to support her getting guardianship of the patient.   CSW informed that she will reach out to psychiatrist to contact mother to address any additional questions mother may have.   Sherryle Margo, MSW, LCSW 01/06/2025 12:30 PM

## 2025-01-06 NOTE — B[facility] Suicide Risk Assessment (Signed)
 [facility] Discharge Suicide Risk Assessment   Principal Problem: Psychosis, unspecified psychosis type (HCC) Discharge Diagnoses: Principal Problem:   Psychosis, unspecified psychosis type (HCC)   Total Time spent with patient: 30 minutes  Musculoskeletal: Strength & Muscle Tone: within normal limits Gait & Station: normal Patient leans: N/A  Psychiatric Specialty Exam  Presentation  General Appearance:  Appropriate for Environment  Eye Contact: Fair  Speech: Clear and Coherent  Speech Volume: Normal  Handedness: Right   Mood and Affect  Mood: Euthymic  Duration of Depression Symptoms: Greater than two weeks  Affect: Appropriate   Thought Process  Thought Processes: Coherent  Descriptions of Associations:Intact  Orientation:Full (Time, Place and Person)  Thought Content:Logical  History of Schizophrenia/Schizoaffective disorder:Yes  Duration of Psychotic Symptoms:Greater than six months  Hallucinations:Hallucinations: None  Ideas of Reference:None  Suicidal Thoughts:Suicidal Thoughts: No  Homicidal Thoughts:Homicidal Thoughts: No   Sensorium  Memory: Immediate Fair  Judgment: Poor  Insight: Shallow   Executive Functions  Concentration: Fair  Attention Span: Fair  Recall: Fair  Fund of Knowledge: Fair  Language: Fair   Psychomotor Activity  Psychomotor Activity: Psychomotor Activity: Normal   Assets  Assets: Communication Skills   Sleep  Sleep: Sleep: Fair  Estimated Sleeping Duration (Last 24 Hours): 6.25-8.00 hours  Physical Exam: Physical Exam ROS Blood pressure (!) 127/94, pulse 93, temperature 98.6 F (37 C), temperature source Oral, resp. rate 16, height 5' 5 (1.651 m), last menstrual period 05/01/2024, SpO2 100%, unknown if currently breastfeeding. Body mass index is 27.46 kg/m.  Mental Status Per Nursing Assessment::   On Admission:  Intention to act on plan to harm others  Demographic Factors:   Low socioeconomic status  Loss Factors: Decrease in vocational status  Historical Factors: Impulsivity  Risk Reduction Factors:   Living with another person, especially a relative, Positive social support, Positive therapeutic relationship, and Positive coping skills or problem solving skills  Continued Clinical Symptoms:  Schizophrenia:   Paranoid or undifferentiated type  Cognitive Features That Contribute To Risk:  None    Suicide Risk:  Minimal: No identifiable suicidal ideation.  Patients presenting with no risk factors but with morbid ruminations; may be classified as minimal risk based on the severity of the depressive symptoms   Follow-up Information     [facility] Follow up.   Why: Appointment is scheduled for 01/09/2025 at 9:00AM. Contact information: [address] [place] [address] [phone]                 Plan Of Care/Follow-up recommendations:  Activity:  as tolerated  Allyn Foil, MD 01/06/2025, 1:34 PM

## 2025-01-06 NOTE — Progress Notes (Signed)
" °  [facility] Adult Case Management Discharge Plan :  Will you be returning to the same living situation after discharge:  Yes,  pt reports that she is returning home.  At discharge, do you have transportation home?: Yes,  pts mother will provide transportation. Do you have the ability to pay for your medications: No.  Release of information consent forms completed and in the chart;  Patient's signature needed at discharge.  Patient to Follow up at:  Follow-up Information     [facility], [facility] Follow up.   Why: Appointment is scheduled for 01/09/2025 at 9:00AM. Contact information: [address] [place] [address] [phone]                 Next level of care provider has access to [facility] Link:no  Safety Planning and Suicide Prevention discussed: Yes,  SPE completed with the patient and patient's mother.      Has patient been referred to the Quitline?: Patient refused referral for treatment  Patient has been referred for addiction treatment: Patient refused referral for treatment.  Sherryle JINNY Margo, LCSW 01/06/2025, 3:36 PM "

## 2025-01-06 NOTE — Discharge Summary (Addendum)
 " Physician Discharge Summary Note  Patient:  Eileen Moody is an 29 y.o., female MRN:  [id number] DOB:  Nov 05, 1996 Patient phone:  [phone] (home)  Patient address:   [address] [place] [address],   Total time spent: 40 min Date of Admission:  01/03/2025 Date of Discharge: 01/06/25  Reason for Admission:  Patient is a 29 year old Caucasian female with a history of schizophrenia who was initially admitted to the inpatient psychiatric unit after presenting to the ED for altered mental status and paranoia. She is currently pregnant, with an estimated due date of January 13, 2025, according to chart review. The last documented prenatal care visit is from October 2025. Patient had started treatment with Quetiapine  200 mg at bedtime but remained in a psychotic state. Her labor was induced yesterday after being discharged from the inpatient psychiatric unit, and she was subsequently admitted to the mother-baby unit for delivery.   Principal Problem: <principal problem not specified> Discharge Diagnoses: Active Problems:   Schizoaffective disorder, bipolar type (HCC)   Past Psychiatric History: see h&p  Family Psychiatric  History: see h&p Social History:  Social History   Substance and Sexual Activity  Alcohol Use No     Social History   Substance and Sexual Activity  Drug Use No    Social History   Socioeconomic History   Marital status: Single    Spouse name: [name]   Number of children: 2   Years of education: Not on file   Highest education level: Not on file  Occupational History   Not on file  Tobacco Use   Smoking status: Every Day    Current packs/day: 1.00    Average packs/day: 1 pack/day for 12.1 years (12.1 ttl pk-yrs)    Types: Cigarettes    Start date: 2014   Smokeless tobacco: Never  Substance and Sexual Activity   Alcohol use: No   Drug use: No   Sexual activity: Yes    Birth control/protection: None  Other Topics Concern   Not on file   Social History Narrative   Not on file   Social Drivers of Health   Tobacco Use: High Risk (01/05/2025)   Patient History    Smoking Tobacco Use: Every Day    Smokeless Tobacco Use: Never    Passive Exposure: Not on file  Financial Resource Strain: Low Risk (01/29/2023)   Received from [facility]   Overall Financial Resource Strain (CARDIA)    Difficulty of Paying Living Expenses: Not hard at all  Food Insecurity: No Food Insecurity (01/03/2025)   Epic    Worried About Programme Researcher, Broadcasting/film/video in the Last Year: Never true    Ran Out of Food in the Last Year: Never true  Transportation Needs: Unmet Transportation Needs (01/03/2025)   Epic    Lack of Transportation (Medical): Yes    Lack of Transportation (Non-Medical): Yes  Physical Activity: Insufficiently Active (01/29/2023)   [facility]   Exercise Vital Sign    On average, how many days per week do you engage in moderate to strenuous exercise (like a brisk walk)?: 2 days    On average, how many minutes do you engage in exercise at this level?: 30 min  Stress: No Stress Concern Present (01/29/2023)   Received from [facility] - Occupational Stress Questionnaire    Feeling of Stress : Not at all  Social Connections: Socially Isolated (12/20/2024)   Social Connection and  Isolation Panel    Frequency of Communication with Friends and Family: Twice a week    Frequency of Social Gatherings with Friends and Family: Twice a week    Attends Religious Services: Never    Database Administrator or Organizations: No    Attends Banker Meetings: Never    Marital Status: Never married  Depression (PHQ2-9): Not on file  Alcohol Screen: Low Risk (01/03/2025)   Alcohol Screen    Last Alcohol Screening Score (AUDIT): 0  Housing: Low Risk (01/03/2025)   Epic    Unable to Pay for Housing in the Last Year: No    Number of Times Moved in the Last Year: 1    Homeless in the  Last Year: No  Utilities: At Risk (01/03/2025)   Epic    Threatened with loss of utilities: Yes  Health Literacy: Not on file   Past Medical History:  Past Medical History:  Diagnosis Date   Anemia    History of anemia    during pregnancy   Schizophrenia (HCC)    STD (sexually transmitted disease) 2015   H/o gonorrhea/chlamydia.  Treated    Past Surgical History:  Procedure Laterality Date   NO PAST SURGERIES     Family History:  Family History  Problem Relation Age of Onset   Diabetes Neg Hx    Hypertension Neg Hx    Cancer Neg Hx     Hospital Course:  Patient is a 29 year old Caucasian female with a history of schizophrenia who was initially admitted to the inpatient psychiatric unit after presenting to the ED for altered mental status and paranoia. She is currently pregnant, with an estimated due date of January 13, 2025, according to chart review. The last documented prenatal care visit is from October 2025. Patient had started treatment with Quetiapine  200 mg at bedtime but remained in a psychotic state. Her labor was induced yesterday after being discharged from the inpatient psychiatric unit, and she was subsequently admitted to the mother-baby unit for delivery. Patient is admitted to psych unit with Q15 min safety monitoring. Multidisciplinary team approach is offered. Medication management; group/milieu therapy is offered.   On admission, patient was very delusional, psychotic and aggressive.  Given her better 7 weeks pregnancy Seroquel  was initiated and dose was slowly titrated up to 300 mg nightly and 200 mg every morning.  Patient responded well and her aggression subsided.  OB/GYN monitored the fetal heart sounds every day and checked on her every day.  Patient went into labor on 01/04/2025 and was discharged back to the psychiatric unit.  Patient displayed significant improvement in her mood.  She started on Abilify  30 mg one-time dose followed by Abilify  Maintena 400 mg  LAI.  Patient's mom was heavily involved in decision making and discharge planning.  Patient took Abilify  maintainer with no reported side effects.  Baby stayed in the hospital in NICU.  APS has been involved as patient continued to display delusions and lacked capacity to make medical decisions.  Patient mom applied for legal guardianship given the frequent episodes of mental health problems/psychosis.  Mom has informed the team that the family plan is to take the baby with them and patient's sister will be responsible to take care of the baby.  Detailed risk assessment is complete based on clinical exam and individual risk factors and acute suicide risk is low and acute violence risk is low.    On the day of discharge, patient denies SI/HI/plan and  denies hallucinations.  Patient remains future oriented and is willing to participate in outpatient mental health services.  Currently, all modifiable risk of harm to self/harm to others have been addressed and patient is no longer appropriate for the acute inpatient setting and is able to continue treatment for mental health needs in the community with the supports as indicated below.  Patient is educated and verbalized understanding of discharge plan of care including medications, follow-up appointments, mental health resources and further crisis services in the community.  He is instructed to call 911 or present to the nearest emergency room should he experience any decompensation in mood, disturbance of bowel or return of suicidal/homicidal ideations.  Patient verbalizes understanding of this education and agrees to this plan of care  Physical Findings: AIMS:  , ,  ,  ,    CIWA:    COWS:      Psychiatric Specialty Exam:  Presentation  General Appearance:  Appropriate for Environment  Eye Contact: Fair  Speech: Clear and Coherent  Speech Volume: Normal    Mood and Affect  Mood: Euthymic  Affect: Appropriate   Thought Process   Thought Processes: Coherent  Descriptions of Associations:Intact  Orientation:Full (Time, Place and Person)  Thought Content:Logical  Hallucinations:No data recorded  Ideas of Reference:None  Suicidal Thoughts:No data recorded  Homicidal Thoughts:No data recorded   Sensorium  Memory: Immediate Fair  Judgment: Poor  Insight: Shallow   Executive Functions  Concentration: Fair  Attention Span: Fair  Recall: [employer] of Knowledge: Fair  Language: Fair   Psychomotor Activity  Psychomotor Activity: No data recorded  Musculoskeletal: Strength & Muscle Tone: within normal limits Gait & Station: normal Assets  Assets: Communication Skills   Sleep  Sleep: No data recorded    Physical Exam: Physical Exam Vitals and nursing note reviewed.    ROS Blood pressure (!) 127/94, pulse 93, temperature 98.6 F (37 C), temperature source Oral, resp. rate 16, height 5' 5 (1.651 m), last menstrual period 05/01/2024, SpO2 100%, unknown if currently breastfeeding. Body mass index is 27.46 kg/m.   Tobacco Use History[1] Tobacco Cessation:  N/A, patient does not currently use tobacco products   Blood Alcohol level:  Lab Results  Component Value Date   [facility] <15 06/20/2024   ETH <10 05/23/2022    Metabolic Disorder Labs:  Lab Results  Component Value Date   HGBA1C 4.7 (L) 12/26/2024   MPG 88.19 12/26/2024   MPG 88.19 05/23/2022   No results found for: PROLACTIN Lab Results  Component Value Date   CHOL 220 (H) 12/26/2024   TRIG 181 (H) 12/26/2024   HDL 80 12/26/2024   CHOLHDL 2.8 12/26/2024   VLDL 36 12/26/2024   LDLCALC 104 (H) 12/26/2024   LDLCALC 83 05/23/2022    See Psychiatric Specialty Exam and Suicide Risk Assessment completed by Attending Physician prior to discharge.  Discharge destination:  Home  Is patient on multiple antipsychotic therapies at discharge:  No   Has Patient had three or more failed trials of antipsychotic  monotherapy by history:  No  Recommended Plan for Multiple Antipsychotic Therapies: NA   Allergies as of 01/06/2025   No Known Allergies      Medication List     STOP taking these medications    benzocaine -Menthol  20-0.5 % Aero Commonly known as: DERMOPLAST       TAKE these medications      Indication  acetaminophen  500 MG tablet Commonly known as: TYLENOL  Take 2 tablets (1,000 mg  total) by mouth every 6 (six) hours.  Indication: Pain   ARIPiprazole  ER 400 MG Prsy prefilled syringe Commonly known as: ABILIFY  MAINTENA Inject 400 mg into the muscle every 28 (twenty-eight) days. Next dose 02/06/25  Indication: Manic-Depression   dibucaine 1 % Oint Commonly known as: NUPERCAINAL Place 1 Application rectally as needed for hemorrhoids. What changed: Another medication with the same name was added. Make sure you understand how and when to take each.  Indication: Burn   dibucaine 1 % Oint Commonly known as: NUPERCAINAL Place 1 Application rectally as needed for hemorrhoids. What changed: You were already taking a medication with the same name, and this prescription was added. Make sure you understand how and when to take each.  Indication: Anal and/or Rectal Pain   docusate sodium  100 MG capsule Commonly known as: COLACE Take 1 capsule (100 mg total) by mouth 2 (two) times daily.  Indication: Constipation   ferrous sulfate  325 (65 FE) MG tablet Take 1 tablet (325 mg total) by mouth daily with breakfast.  Indication: Iron Deficiency   ibuprofen  600 MG tablet Commonly known as: ADVIL  Take 1 tablet (600 mg total) by mouth every 6 (six) hours as needed for moderate pain (pain score 4-6) or cramping. What changed:  when to take this reasons to take this  Indication: Pain   Prenatal 27-1 MG Tabs Take 1 tablet by mouth daily at 12 noon.  Indication: LACTATION RELATED TO CHILDBIRTH   QUEtiapine  300 MG tablet Commonly known as: SEROQUEL  Take 1 tablet (300 mg total)  by mouth at bedtime. What changed: Another medication with the same name was added. Make sure you understand how and when to take each.  Indication: Manic Phase of Manic-Depression, Schizophrenia   QUEtiapine  200 MG tablet Commonly known as: SEROQUEL  Take 1 tablet (200 mg total) by mouth in the morning. What changed: You were already taking a medication with the same name, and this prescription was added. Make sure you understand how and when to take each.  Indication: Manic Phase of Manic-Depression, Schizophrenia   witch hazel-glycerin  pad Commonly known as: TUCKS Apply topically as needed for itching. What changed:  how much to take reasons to take this  Indication: Cramping Pain in the Abdomen   witch hazel-glycerin  pad Commonly known as: TUCKS Apply 1 Application topically as needed for hemorrhoids. What changed: You were already taking a medication with the same name, and this prescription was added. Make sure you understand how and when to take each.  Indication: Cramping Pain in the Abdomen        Follow-up Information     [facility], [facility] Follow up.   Why: Appointment is scheduled for 01/09/2025 at 9:00AM.  ACTT referral was sent. Please follow up. Contact information: [name] [place] [address] [phone]         [facility] Follow up.   Why: ACTT referral was sent. Please follow up. Contact information: [address] [place] [address] [phone]         [facility] Follow up.   Why: ACTT referral was sent.  Please follow up. Contact information: [address] [place] [address] [phone]                 Follow-up recommendations:  Activity:  as tolerated    Signed: Lequisha Cammack, MD 01/15/2025, 3:52 PM          [1]  Social History Tobacco Use  Smoking Status Every Day   Current packs/day: 1.00   Average packs/day: 1  pack/day for 12.1 years (12.1 ttl pk-yrs)   Types: Cigarettes   Start date: 2014  Smokeless Tobacco Never   "

## 2025-01-06 NOTE — Group Note (Signed)
 Recreation Therapy Group Note   Group Topic:Animal Assisted Therapy   Group Date: 01/06/2025 Start Time: 1000 End Time: 1030 Facilitators: Celestia Jeoffrey BRAVO, LRT, CTRS Location: Dayroom  Group Description: AAA. Animal-Assisted Activity provides opportunities for motivational, educational, therapeutic and/or recreational benefits to enhance quality of life. [name] and [name] visited the unit to interact with patients.   Goal Areas Addressed:  Reduced anxiety and stress Improved mood Increased social interaction Enhanced communication skills Reduced loneliness and isolation Improved emotional regulation   Affect/Mood: Appropriate   Participation Level: Active and Engaged   Participation Quality: Independent   Behavior: Calm and Cooperative   Speech/Thought Process: Coherent   Insight: Fair   Judgement: Fair    Modes of Intervention: Activity   Patient Response to Interventions:  Attentive, Engaged, and Interested    Education Outcome:  Acknowledges education   Clinical Observations/Individualized Feedback: Eileen Moody was active in their participation of session activities and group discussion. Pt interacted well with LRT and peers duration of session.    Plan: Continue to engage patient in RT group sessions 2-3x/week.   Jeoffrey BRAVO Celestia, LRT, CTRS 01/06/2025 11:25 AM

## 2025-01-06 NOTE — Group Note (Signed)
 [facility] LCSW Group Therapy Note   Group Date: 01/06/2025 Start Time: 1335 End Time: 1415  Type of Therapy/Topic:  Group Therapy:  Feelings about Diagnosis  Participation Level:  Minimal   Description of Group:    This group will allow patients to explore their thoughts and feelings about diagnoses they have received. Patients will be guided to explore their level of understanding and acceptance of these diagnoses. Facilitator will encourage patients to process their thoughts and feelings about the reactions of others to their diagnosis, and will guide patients in identifying ways to discuss their diagnosis with significant others in their lives. This group will be process-oriented, with patients participating in exploration of their own experiences as well as giving and receiving support and challenge from other group members.   Therapeutic Goals: 1. Patient will demonstrate understanding of diagnosis as evidence by identifying two or more symptoms of the disorder:  2. Patient will be able to express two feelings regarding the diagnosis 3. Patient will demonstrate ability to communicate their needs through discussion and/or role plays  Summary of Patient Progress: Patient was present in group.  Patient participated minimally in group.  Patient attempted to be a support in group to others.   Therapeutic Modalities:   Cognitive Behavioral Therapy Brief Therapy Feelings Identification    Sherryle JINNY Margo, LCSW

## 2025-01-06 NOTE — BHH Counselor (Signed)
 CSW spoke with Coralee Portugal, mom, [phone].    Mother reports no concerns at the possibility of patient being discharged.   She reports patient is clearer and closer to her baseline.  She reports no weapon concerns.  She reports pt will return to her apartment.   She reports that she will provide transportation.  She reports that she is not sure what plan has been developed for the patient's newborn.  Sherryle Margo, MSW, LCSW 01/06/2025 8:19 AM

## 2025-01-06 NOTE — Progress Notes (Signed)
 Pt is A&Ox4. She denies SI/HI/AVH this shift. She reports pelvic pain rated 3/10 which is managed with Q6 hr scheduled pain medications.   01/05/25 2300  Psych Admission Type (Psych Patients Only)  Admission Status Involuntary  Psychosocial Assessment  Patient Complaints None  Eye Contact Fair  Facial Expression Flat  Affect Appropriate to circumstance  Speech Logical/coherent  Interaction Assertive  Motor Activity Other (Comment) (wnl)  Appearance/Hygiene Unremarkable  Behavior Characteristics Appropriate to situation  Mood Pleasant  Thought Process  Coherency WDL  Content WDL  Delusions None reported or observed  Perception WDL  Hallucination None reported or observed  Judgment WDL  Confusion None  Danger to Self  Current suicidal ideation? Denies  Agreement Not to Harm Self Yes  Description of Agreement verbal  Danger to Others  Danger to Others None reported or observed  Danger to Others Abnormal  Harmful Behavior to others No threats or harm toward other people  Destructive Behavior No threats or harm toward property

## 2025-01-06 NOTE — BHH Counselor (Signed)
 CSW has completed ACTT team referrals for the patient.  Referrals were submitted successfully.  Sherryle Margo, MSW, LCSW 01/06/2025 3:54 PM

## 2025-01-06 NOTE — Plan of Care (Signed)

## 2025-01-15 DIAGNOSIS — F250 Schizoaffective disorder, bipolar type: Secondary | ICD-10-CM | POA: Diagnosis present
